# Patient Record
Sex: Male | Born: 1945
Health system: Southern US, Community
[De-identification: ages and names within clinical notes are randomized; demographics above are authoritative.]

## PROBLEM LIST (undated history)

## (undated) ENCOUNTER — Inpatient Hospital Stay: Admission: EM | Payer: Self-pay | Source: Home / Self Care

## (undated) DIAGNOSIS — I82409 Acute embolism and thrombosis of unspecified deep veins of unspecified lower extremity: Secondary | ICD-10-CM

## (undated) DIAGNOSIS — I2510 Atherosclerotic heart disease of native coronary artery without angina pectoris: Secondary | ICD-10-CM

## (undated) DIAGNOSIS — E7800 Pure hypercholesterolemia, unspecified: Secondary | ICD-10-CM

## (undated) HISTORY — PX: KNEE SURGERY: SHX244

## (undated) HISTORY — DX: Acute embolism and thrombosis of unspecified deep veins of unspecified lower extremity: I82.409

## (undated) HISTORY — PX: FRACTURE SURGERY: SHX138

## (undated) HISTORY — DX: Pure hypercholesterolemia, unspecified: E78.00

## (undated) HISTORY — PX: CARDIAC CATHETERIZATION: SHX172

## (undated) HISTORY — PX: OTHER SURGICAL HISTORY: SHX169

## (undated) HISTORY — PX: ROTATOR CUFF REPAIR: SHX139

## (undated) HISTORY — PX: CHOLECYSTECTOMY: SHX55

## (undated) HISTORY — DX: Atherosclerotic heart disease of native coronary artery without angina pectoris: I25.10

---

## 2001-11-16 ENCOUNTER — Ambulatory Visit (HOSPITAL_COMMUNITY): Admission: RE | Admit: 2001-11-16 | Discharge: 2001-11-16 | Payer: Self-pay | Admitting: Family Medicine

## 2001-11-16 ENCOUNTER — Encounter: Payer: Self-pay | Admitting: Family Medicine

## 2001-11-26 ENCOUNTER — Ambulatory Visit (HOSPITAL_COMMUNITY): Admission: RE | Admit: 2001-11-26 | Discharge: 2001-11-26 | Payer: Self-pay | Admitting: General Surgery

## 2004-10-01 ENCOUNTER — Ambulatory Visit (HOSPITAL_COMMUNITY): Admission: RE | Admit: 2004-10-01 | Discharge: 2004-10-01 | Payer: Self-pay | Admitting: Family Medicine

## 2007-01-08 ENCOUNTER — Ambulatory Visit (HOSPITAL_COMMUNITY): Payer: Self-pay | Admitting: Oncology

## 2007-01-08 ENCOUNTER — Encounter (HOSPITAL_COMMUNITY): Admission: RE | Admit: 2007-01-08 | Discharge: 2007-02-07 | Payer: Self-pay | Admitting: Oncology

## 2007-06-02 ENCOUNTER — Encounter (HOSPITAL_COMMUNITY): Admission: RE | Admit: 2007-06-02 | Discharge: 2007-06-16 | Payer: Self-pay | Admitting: Oncology

## 2007-06-02 ENCOUNTER — Ambulatory Visit (HOSPITAL_COMMUNITY): Payer: Self-pay | Admitting: Oncology

## 2007-11-23 ENCOUNTER — Ambulatory Visit (HOSPITAL_COMMUNITY): Admission: RE | Admit: 2007-11-23 | Discharge: 2007-11-23 | Payer: Self-pay | Admitting: Family Medicine

## 2009-05-25 ENCOUNTER — Ambulatory Visit: Payer: Self-pay | Admitting: Surgery

## 2010-10-24 ENCOUNTER — Ambulatory Visit: Payer: Self-pay | Admitting: Gastroenterology

## 2010-10-29 NOTE — Procedures (Signed)
DUPLEX DEEP VENOUS EXAM - LOWER EXTREMITY   INDICATION:  Previous PE.  Previous DVT.   HISTORY:  Edema:  No.  Trauma/Surgery:  No.  Pain:  No.  PE:  Yes, December, 2009.  Previous DVT:  Yes, right leg.  Anticoagulants:  On Coumadin.  Other:   DUPLEX EXAM:                CFV   SFV   PopV  PTV    GSV                R  L  R  L  R  L  R   L  R  L  Thrombosis    o  o  o  o  o  o  o   o  o  o  Spontaneous   +  +  +  +  +  +  +   +  +  +  Phasic        +  +  +  +  +  +  +   +  +  +  Augmentation  +  +  +  +  +  +  +   +  +  +  Compressible  +  +  +  +  +  +  +   +  +  +  Competent     +  +  +  +  +  +  +   +  +  +   Legend:  + - yes  o - no  p - partial  D - decreased   IMPRESSION:  There does not appear to be any evidence of deep venous  thrombosis in bilateral legs.    _____________________________  V. Charlena Cross, MD   CB/MEDQ  D:  05/25/2009  T:  05/26/2009  Job:  [id number]

## 2010-11-01 NOTE — Op Note (Signed)
[facility]  Patient:    [name] [name], [name] [name] Visit Number: [id number] MRN: [id number]          Service Type: DSU Location: DAY Attending Physician:  Dalia Heading Dictated by:   Franky Macho, M.D. Proc. Date: 11/26/01 Admit Date:  11/26/2001   CC:         Karleen Hampshire, M.D.   Operative Report  PREOPERATIVE DIAGNOSES:  Cholecystitis, cholelithiasis.  POSTOPERATIVE DIAGNOSES:  Cholecystitis, cholelithiasis.  PROCEDURE:  Laparoscopic cholecystectomy.  SURGEON:  Franky Macho, M.D.  ASSISTANT:  Arna Snipe, M.D.  ANESTHESIA:  General endotracheal.  INDICATIONS FOR PROCEDURE:  The patient is a 65 year old white male who was referred for evaluation and treatment of biliary colic secondary to adenomyosis and cholelithiasis. The risks and benefits of the procedure including bleeding, infection, hepatobiliary injury, and the possibility of an open procedure were fully explained to the patient, who gave informed consent.  DESCRIPTION OF PROCEDURE:  The patient was placed in the supine position. After induction of general endotracheal anesthesia, the abdomen was prepped and draped using the usual sterile technique with Betadine. A supraumbilical incision was made down to the fascia. A Veress needle was introduced into the abdominal cavity and confirmation of placement was done using the saline drop test. The abdomen was then insufflated to 16 mmHg pressure. An 11 mm trocar was introduced into the abdominal cavity under direct visualization without difficulty. The patient was placed in reverse Trendelenburg position. An additional 11 mm trocar was placed in the epigastric region and 5 mm trocars placed in the right upper quadrant and right flank regions. The liver was inspected and noted to be within normal limits. The gallbladder was retracted superiorly and laterally. The dissection was begun around the infundibulum of the gallbladder. The cystic  duct was first identified. Its junction to the infundibulum fully identified. The clips were placed proximally and distally on the cystic duct and the cystic duct was divided This was likewise done in the cystic artery. The gallbladder was then freed away from the gallbladder fossa using Bovie electrocautery. The gallbladder was delivered through the epigastric trocar site using and endocatch bag. The gallbladder fossa was inspected and no abnormal bleeding or bile leakage was noted. Surgicel was placed in the gallbladder fossa. The subhepatic space as well as the right hepatic gutter were irrigated with normal saline. All fluid and air were then evacuated from the abdominal cavity prior to removing the trocars.  All wounds were irrigated with normal saline. All wounds were injected with 0.5% Sensorcaine. The supraumbilical fascia was reapproximated using an #0 Vicryl interrupted suture. All skin incisions were closed using staples. Betadine ointment and dry sterile dressings were applied.  All tape and needle counts were correct at the end of the procedure. The patient was extubated in the operating room and went back to the recovery room awake in stable condition.  COMPLICATIONS:  None.  SPECIMEN:  Gallbladder.  ESTIMATED BLOOD LOSS:  Minimal. Dictated by:   Franky Macho, M.D. Attending Physician:  Dalia Heading DD:  11/26/01 TD:  11/27/01 Job: [id number]

## 2010-11-01 NOTE — H&P (Signed)
[facility]  Patient:    [name] [name] Visit Number: [id number] MRN: [id number]          Service Type: OUT Location: RAD Attending Physician:  [name] [name] Dictated by:   [name] [name], M.D. Admit Date:  11/16/2001 Discharge Date: 11/16/2001   CC:         [name] [name], M.D.   History and Physical  AGE:  65 years old.  CHIEF COMPLAINT:  Biliary colic.  HISTORY OF PRESENT ILLNESS:  The patient is a 65 year old white male who is referred for evaluation and treatment of right upper quadrant abdominal pain. It has been present intermittently for many years, but recently seems to have gotten worse.  He states he has had right upper quadrant abdominal pain radiating to the right flank, nausea, vomiting, and indigestion for many years.  There is a question of bloating.  No fatty food intolerance is noted by the patient.  He seems to constantly hurt now in the right upper quadrant. Nexium is not helpful.  PAST MEDICAL HISTORY:  Coronary artery disease, hypertension, high cholesterol levels.  PAST SURGICAL HISTORY:  Heart catheterization seven years ago by Dr. Lacretia [name]. [name] Fish, Jr., left inguinal herniorrhaphy, right knee surgery last year, left rotator cuff repair in the past.  CURRENT MEDICATIONS: 1. Lipitor 40 mg p.o. q.d. 2. Altace 5 mg p.o. q.d. 3. Nexium 40 mg p.o. q.d.  ALLERGIES:  No known drug allergies.  REVIEW OF SYSTEMS:  The patient denies drinking or smoking.  He denies any recent chest pain, angina, shortness of breath, leg swelling, CVA or diabetes mellitus.  He denies any bleeding disorders.  PHYSICAL EXAMINATION:  GENERAL:  On physical examination, the patient is a well-developed, well-nourished white male in no acute distress.  VITAL SIGNS:  He is afebrile and vital signs are stable.  HEENT:  No scleral icterus.  LUNGS:  Lungs are clear to auscultation with equal breath sounds bilaterally.  HEART:   Examination reveals a regular rate and rhythm without S3, S4, or murmurs.  ABDOMEN:  The abdomen is soft and nondistended.  No hepatosplenomegaly, masses, or herniae are identified.  He is tender in the right upper quadrant to palpation.  LABORATORY AND ACCESSORY DATA:  Ultrasound of the gallbladder reveals a thickened gallbladder wall with a positive Murphys sign and ______ .  IMPRESSION:  Biliary colic.  PLAN:  The patient is scheduled for a laparoscopic cholecystectomy on November 26, 2001.  The risks and benefits of the procedure including bleeding, infection, hepatobiliary injury, and the possibility of an open procedure were fully explained to the patient, who gave informed consent.  We will get cardiac records from Dr. Donnie [name]. Dictated by:   [name] [name], M.D. Attending Physician:  [name] [name] DD:  11/23/01 TD:  11/25/01 Job: [id number]

## 2011-03-21 LAB — LUPUS ANTICOAGULANT PANEL: Lupus Anticoagulant: NOT DETECTED

## 2011-03-21 LAB — APTT: aPTT: 30

## 2011-03-21 LAB — APTT MIXING STUDY SCREEN: aPTT Baseline: NORMAL

## 2011-03-21 LAB — PROTIME-INR
INR: 1
Prothrombin Time: 13.2

## 2011-03-31 LAB — APTT: aPTT: 34

## 2011-03-31 LAB — PROTIME-INR: Prothrombin Time: 18.7 — ABNORMAL HIGH

## 2011-06-17 HISTORY — PX: LUMBAR SPINE SURGERY: SHX701

## 2012-05-16 DIAGNOSIS — I82409 Acute embolism and thrombosis of unspecified deep veins of unspecified lower extremity: Secondary | ICD-10-CM

## 2012-05-16 HISTORY — DX: Acute embolism and thrombosis of unspecified deep veins of unspecified lower extremity: I82.409

## 2012-05-31 ENCOUNTER — Other Ambulatory Visit (HOSPITAL_COMMUNITY): Payer: Self-pay | Admitting: Physician Assistant

## 2012-05-31 DIAGNOSIS — I872 Venous insufficiency (chronic) (peripheral): Secondary | ICD-10-CM

## 2012-06-01 ENCOUNTER — Emergency Department (HOSPITAL_COMMUNITY)
Admission: EM | Admit: 2012-06-01 | Discharge: 2012-06-01 | Disposition: A | Discharge status: Home / Self Care | Payer: Medicare Other | Attending: Emergency Medicine | Admitting: Emergency Medicine

## 2012-06-01 ENCOUNTER — Ambulatory Visit (HOSPITAL_COMMUNITY)
Admission: RE | Admit: 2012-06-01 | Discharge: 2012-06-01 | Disposition: A | Discharge status: Home / Self Care | Payer: Medicare Other | Source: Ambulatory Visit | Attending: Physician Assistant | Admitting: Physician Assistant

## 2012-06-01 ENCOUNTER — Encounter (HOSPITAL_COMMUNITY): Payer: Self-pay

## 2012-06-01 ENCOUNTER — Emergency Department (HOSPITAL_COMMUNITY): Payer: Medicare Other

## 2012-06-01 DIAGNOSIS — Z9089 Acquired absence of other organs: Secondary | ICD-10-CM | POA: Insufficient documentation

## 2012-06-01 DIAGNOSIS — I824Y9 Acute embolism and thrombosis of unspecified deep veins of unspecified proximal lower extremity: Secondary | ICD-10-CM | POA: Insufficient documentation

## 2012-06-01 DIAGNOSIS — I872 Venous insufficiency (chronic) (peripheral): Secondary | ICD-10-CM

## 2012-06-01 DIAGNOSIS — F17200 Nicotine dependence, unspecified, uncomplicated: Secondary | ICD-10-CM | POA: Insufficient documentation

## 2012-06-01 DIAGNOSIS — Z9889 Other specified postprocedural states: Secondary | ICD-10-CM | POA: Insufficient documentation

## 2012-06-01 LAB — CBC WITH DIFFERENTIAL/PLATELET
Basophils Absolute: 0 10*3/uL (ref 0.0–0.1)
Basophils Relative: 0 % (ref 0–1)
Eosinophils Relative: 3 % (ref 0–5)
HCT: 48.4 % (ref 39.0–52.0)
MCHC: 34.5 g/dL (ref 30.0–36.0)
Monocytes Absolute: 0.7 10*3/uL (ref 0.1–1.0)
Neutro Abs: 7.8 10*3/uL — ABNORMAL HIGH (ref 1.7–7.7)
Platelets: 175 10*3/uL (ref 150–400)
RDW: 13.2 % (ref 11.5–15.5)

## 2012-06-01 LAB — BASIC METABOLIC PANEL
Calcium: 9.7 mg/dL (ref 8.4–10.5)
Chloride: 101 mEq/L (ref 96–112)
Creatinine, Ser: 1.22 mg/dL (ref 0.50–1.35)
GFR calc Af Amer: 70 mL/min — ABNORMAL LOW (ref >90)
Sodium: 139 mEq/L (ref 135–145)

## 2012-06-01 LAB — PROTIME-INR
INR: 1.58 — ABNORMAL HIGH (ref 0.00–1.49)
Prothrombin Time: 18.4 seconds — ABNORMAL HIGH (ref 11.6–15.2)

## 2012-06-01 MED ORDER — ENOXAPARIN SODIUM 100 MG/ML ~~LOC~~ SOLN
85.0000 mg | Freq: Once | SUBCUTANEOUS | Status: AC
  Administered 2012-06-01: 85 mg via SUBCUTANEOUS
  Filled 2012-06-01: qty 1

## 2012-06-01 MED ORDER — ENOXAPARIN SODIUM 150 MG/ML ~~LOC~~ SOLN: 80.0000 mg | Freq: Every day | SUBCUTANEOUS | Status: DC

## 2012-06-01 NOTE — ED Provider Notes (Signed)
History   This chart was scribed for [name] Hutching, MD by Sofie Rower, ED Scribe. The patient was seen in room APA18/APA18 and the patient's care was started at 9:10AM.   CSN: [id number]  Arrival date & time 06/01/12  2440   First MD Initiated Contact with Patient 06/01/12 [phone]      Chief Complaint  Patient presents with  . Leg Swelling    (Consider location/radiation/quality/duration/timing/severity/associated sxs/prior treatment) The history is provided by the patient. No language interpreter was used.    [name] [name] is a 66 y.o. male , with a hx of DVT (occured 7 years ago, successfully treated with Lovenox), cholecystectomy, left knee surgery, and left foot surgery, who presents to the Emergency Department complaining of sudden, progressively worsening, leg swelling located at the proximal, medial, right thigh, onset one week ago. The pt reports he was taking warfarin 4 months ago, however, he ceased its application to due experiencing blood clots.  The pt denies chest pain. Additionally, pt denies any hx of hypertension or diabetes.   The pt is a current everyday smoker, in addition to drinking alcohol.   PCP is Dr. Regino Schultze.    History reviewed. No pertinent past medical history.  Past Surgical History  Procedure Date  . Cholecystectomy   . Fracture surgery     No family history on file.  History  Substance Use Topics  . Smoking status: Current Every Day Smoker  . Smokeless tobacco: Not on file  . Alcohol Use: Yes      Review of Systems  All other systems reviewed and are negative.    Allergies  Review of patient's allergies indicates no known allergies.  Home Medications  No current outpatient prescriptions on file.  BP 124/77  Pulse 85  Resp 16  Ht 6\' 2"  (1.88 m)  Wt 190 lb (86.183 kg)  BMI 24.39 kg/m2  SpO2 97%  Physical Exam  Nursing note and vitals reviewed. Constitutional: He is oriented to person, place, and time. He appears well-developed  and well-nourished.  HENT:  Head: Normocephalic and atraumatic.  Eyes: Conjunctivae normal and EOM are normal. Pupils are equal, round, and reactive to light.  Neck: Normal range of motion. Neck supple.  Cardiovascular: Normal rate, regular rhythm and normal heart sounds.   Pulmonary/Chest: Effort normal and breath sounds normal.  Abdominal: Soft. Bowel sounds are normal.  Musculoskeletal: Normal range of motion.       Swelling detected at the Proximal medial right thigh.  Neurological: He is alert and oriented to person, place, and time.  Skin: Skin is warm and dry.  Psychiatric: He has a normal mood and affect.    ED Course  Procedures (including critical care time)  DIAGNOSTIC STUDIES: Oxygen Saturation is 97% on room air, normal by my interpretation.    COORDINATION OF CARE:  9:31 AM- Treatment plan concerning ultrasound of right lower extremity discussed with patient. Pt agrees with treatment.     Results for orders placed during the hospital encounter of 06/01/12  CBC WITH DIFFERENTIAL      Component Value Range   WBC 10.3  4.0 - 10.5 K/uL   RBC 5.18  4.22 - 5.81 MIL/uL   Hemoglobin 16.7  13.0 - 17.0 g/dL   HCT 25.3  66.4 - 40.3 %   MCV 93.4  78.0 - 100.0 fL   MCH 32.2  26.0 - 34.0 pg   MCHC 34.5  30.0 - 36.0 g/dL   RDW 47.4  25.9 - 56.3 %  Platelets 175  150 - 400 K/uL   Neutrophils Relative 75  43 - 77 %   Neutro Abs 7.8 (*) 1.7 - 7.7 K/uL   Lymphocytes Relative 16  12 - 46 %   Lymphs Abs 1.6  0.7 - 4.0 K/uL   Monocytes Relative 6  3 - 12 %   Monocytes Absolute 0.7  0.1 - 1.0 K/uL   Eosinophils Relative 3  0 - 5 %   Eosinophils Absolute 0.3  0.0 - 0.7 K/uL   Basophils Relative 0  0 - 1 %   Basophils Absolute 0.0  0.0 - 0.1 K/uL  BASIC METABOLIC PANEL      Component Value Range   Sodium 139  135 - 145 mEq/L   Potassium 4.6  3.5 - 5.1 mEq/L   Chloride 101  96 - 112 mEq/L   CO2 30  19 - 32 mEq/L   Glucose, Bld 110 (*) 70 - 99 mg/dL   BUN 11  6 - 23 mg/dL    Creatinine, Ser 8.65  0.50 - 1.35 mg/dL   Calcium 9.7  8.4 - 78.4 mg/dL   GFR calc non Af Amer 61 (*) >90 mL/min   GFR calc Af Amer 70 (*) >90 mL/min  PROTIME-INR      Component Value Range   Prothrombin Time 18.4 (*) 11.6 - 15.2 seconds   INR 1.58 (*) 0.00 - 1.49   US Venous Img Lower Unilateral Right  06/01/2012  *RADIOLOGY REPORT*  Clinical Data: Right thigh pain.  Right LOWER EXTREMITY VENOUS DUPLEX ULTRASOUND  Technique:  Gray-scale sonography with graded compression, as well as color Doppler and duplex ultrasound, were performed to evaluate the deep venous system of the lower extremity from the level of the common femoral vein through the popliteal and proximal calf veins. Spectral Doppler was utilized to evaluate flow at rest and with distal augmentation maneuvers.  Comparison:  None.  Findings: The study is positive for deep venous thrombosis with clot seen from the right common femoral vein through the popliteal vein.  There is partial occlusion of the right common femoral vein. Occlusive thrombus is seen from the profunda femoris through the superficial femoral vein with no compressibility or augmentation of flow and echogenic clot present.  Partially occlusive thrombus is seen in the popliteal vein.  IMPRESSION: Extensive deep venous thrombosis right leg as described.   Original Report Authenticated By: Holley Dexter, M.D.         No diagnosis found.    MDM  Doppler study of right lower extremity shows extensive deep venous thrombosis from the right common femoral vein to the popliteal vein.  Rx IV Lovenox 1 mg per kilogram subcutaneous.  Prescription given for same. Will follow up with primary care Dr. This week.      I personally performed the services described in this documentation, which was scribed in my presence. The recorded information has been reviewed and is accurate.    [name] Hutching, MD 06/01/12 [phone]

## 2012-12-16 ENCOUNTER — Encounter: Payer: Self-pay | Admitting: Gastroenterology

## 2012-12-30 ENCOUNTER — Encounter: Payer: Self-pay | Admitting: Internal Medicine

## 2013-01-03 ENCOUNTER — Encounter: Payer: Self-pay | Admitting: Gastroenterology

## 2013-01-03 ENCOUNTER — Ambulatory Visit (INDEPENDENT_AMBULATORY_CARE_PROVIDER_SITE_OTHER): Payer: Medicare Other | Admitting: Gastroenterology

## 2013-01-03 VITALS — BP 108/79 | HR 89 | Temp 97.4°F | Ht 74.0 in | Wt 198.2 lb

## 2013-01-03 DIAGNOSIS — Z7901 Long term (current) use of anticoagulants: Secondary | ICD-10-CM

## 2013-01-03 DIAGNOSIS — K920 Hematemesis: Secondary | ICD-10-CM

## 2013-01-03 DIAGNOSIS — Z1211 Encounter for screening for malignant neoplasm of colon: Secondary | ICD-10-CM

## 2013-01-03 NOTE — Assessment & Plan Note (Signed)
67 year old gentleman with a 5 to seven-day history of vomiting with eventual hematemesis per report. Patient does not recall seeing blood but this is when his daughter stated to him. His INR was supratherapeutic as outlined above. Question gastritis versus peptic ulcer disease versus Mallory-Weiss tear. Patient denies ongoing vomiting or hematemesis. He denies melena or hematochezia. She's never had an upper endoscopy or colonoscopy. He is on Coumadin for history of recurrent DVT. States he could not afford the Xarelto which was recently prescribed. States he never had workup for recurrent DVT but admits to being fairly inactive.  Discussed the need to tightly control INR with patient. Initially he was quite reluctant to any endoscopic evaluation but explained to him that we could not rule out underlying malignancy or other etiology that could be problematic with chronic anticoagulation. At this time he is agreeable to upper endoscopy and colonoscopy (which is predominantly screening) in the near future. We will contact his PCP to determine whether or not he should have Lovenox bridge in the setting of recurrent DVT off Coumadin. Once this information has been obtained we will schedule him for an EGD and colonoscopy.  I have discussed the risks, alternatives, benefits with regards to but not limited to the risk of reaction to medication, bleeding, infection, perforation and the patient is agreeable to proceed. Written consent to be obtained.

## 2013-01-03 NOTE — Patient Instructions (Addendum)
1. We will call to schedule your colonoscopy and upper endoscopy as soon as I speak with your primary care provider regarding Coumadin. Further recommendations to follow.

## 2013-01-03 NOTE — Progress Notes (Signed)
Cc PCP 

## 2013-01-03 NOTE — Progress Notes (Signed)
Primary Care Physician:  [name] Ruths, MD  Primary Gastroenterologist:  Roetta Sessions, MD   Chief Complaint  Patient presents with  . Colonoscopy    HPI:  [name] [name] is a 67 y.o. male here for further evaluation and hematemesis, hematochezia/melena which developed when his INR was greater than 7.9. Patient denies hematochezia and melena.  States he has been on coumadin chronically for DVT. Has had two DVTs, last one in 05/2012. First DVT treated with coumadin but came off medication at some point. Patient cannot recall facts. Then developed second DVT 05/2012 (off coumadin at the time). States he was not real good at getting INR checked.   Developed N/V for 5-7 days. Went to ER at [facility], transferred to [facility]. Per patient, his daughter said was throwing up blood. He does not recall. No blood in stool per patient. No constipation, diarrhea. No abdominal pain. No significant heartburn. No dysphagia. Never started pantoprazole as recommended by PCP. No unintentional weight loss.  Reviewed records available via "Care Everywhere".  H. Pylori serology Negative. Labs from 12/12/2012, hemoglobin 13.1, hematocrit 38.6, MCV 91.7, platelets 192,000, white blood cell count 7400. On presentation at [facility] on 12/10/12 his PT was greater than 100, INR cannot be calculated. INR was 1.43 on 12/12/2012.      Current Outpatient Prescriptions  Medication Sig Dispense Refill  . aspirin 81 MG tablet Take 81 mg by mouth daily.      Marland Kitchen LORazepam (ATIVAN) 1 MG tablet Take 1 mg by mouth every 8 (eight) hours as needed.       . warfarin (COUMADIN) 5 MG tablet Take 5 mg by mouth every other day. Alternates 5/6mg .       No current facility-administered medications for this visit.    Allergies as of 01/03/2013  . (No Known Allergies)    Past Medical History  Diagnosis Date  . Coronary artery disease     MI over 20 years ago.  Marland Kitchen Hypertension   . High cholesterol   . DVT (deep venous thrombosis)  05/2012    once prior to 05/2012 as well. Had stopped coumadin and developed second clot.     Past Surgical History  Procedure Laterality Date  . Cholecystectomy    . Fracture surgery    . Cardiac catheterization    . Left inguinal herniorrhaphy    . Knee surgery      right  . Rotator cuff repair      left  . Lumbar spine surgery  2013    Family History  Problem Relation Age of Onset  . Colon cancer Neg Hx     History   Social History  . Marital Status: Divorced    Spouse Name: N/A    Number of Children: 4  . Years of Education: N/A   Occupational History  . unemployed    Social History Main Topics  . Smoking status: Current Every Day Smoker  . Smokeless tobacco: Not on file  . Alcohol Use: Yes     Comment: used to drink two beers per day in summertime, but none since 09/2012.  . Drug Use: No  . Sexually Active: Not on file   Other Topics Concern  . Not on file   Social History Narrative  . No narrative on file      ROS:  General: Negative for anorexia, weight loss, fever, chills, fatigue, weakness. Eyes: Negative for vision changes.  ENT: Negative for hoarseness, difficulty swallowing , nasal congestion. CV: Negative for  chest pain, angina, palpitations, dyspnea on exertion, peripheral edema.  Respiratory: Negative for dyspnea at rest, dyspnea on exertion, cough, sputum, wheezing.  GI: See history of present illness. GU:  Negative for dysuria, hematuria, urinary incontinence, urinary frequency, nocturnal urination.  MS: Negative for joint pain, low back pain.  Derm: Negative for rash or itching.  Neuro: Negative for weakness, abnormal sensation, seizure, frequent headaches, memory loss, confusion.  Psych: Negative for anxiety, depression, suicidal ideation, hallucinations.  Endo: Negative for unusual weight change.  Heme: Negative for bruising or bleeding. Allergy: Negative for rash or hives.    Physical Examination:  BP 108/79  Pulse 89   Temp(Src) 97.4 F (36.3 C) (Oral)  Ht 6\' 2"  (1.88 m)  Wt 198 lb 3.2 oz (89.903 kg)  BMI 25.44 kg/m2   General: Well-nourished, well-developed in no acute distress.  Head: Normocephalic, atraumatic.   Eyes: Conjunctiva pink, no icterus. Mouth: Oropharyngeal mucosa moist and pink , no lesions erythema or exudate. Neck: Supple without thyromegaly, masses, or lymphadenopathy.  Lungs: Clear to auscultation bilaterally.  Heart: Regular rate and rhythm, no murmurs rubs or gallops.  Abdomen: Bowel sounds are normal, nontender, nondistended, no hepatosplenomegaly or masses, no abdominal bruits or    hernia , no rebound or guarding.   Rectal: not performed Extremities: No lower extremity edema. No clubbing or deformities.  Neuro: Alert and oriented x 4 , grossly normal neurologically.  Skin: Warm and dry, no rash or jaundice.   Psych: Alert and cooperative, normal mood and affect.

## 2013-01-05 ENCOUNTER — Telehealth: Payer: Self-pay

## 2013-01-05 ENCOUNTER — Other Ambulatory Visit: Payer: Self-pay | Admitting: Internal Medicine

## 2013-01-05 DIAGNOSIS — Z1211 Encounter for screening for malignant neoplasm of colon: Secondary | ICD-10-CM

## 2013-01-05 MED ORDER — PEG 3350-KCL-NA BICARB-NACL 420 G PO SOLR: 4000.0000 mL | ORAL | Status: DC

## 2013-01-05 NOTE — Telephone Encounter (Signed)
Received fax back from Dr. Regino Schultze and he said ok to hold coumadin 3-4 days prior to procedure and no lovenox bridge necessary.

## 2013-01-05 NOTE — Telephone Encounter (Signed)
Mr. More is scheduled w/RMR on Aug 7th and I have mailed instructions and he is also aware to hold his coumadin for 4 days prior to procedure

## 2013-01-05 NOTE — Telephone Encounter (Signed)
Noted  

## 2013-01-12 ENCOUNTER — Encounter (HOSPITAL_COMMUNITY): Payer: Self-pay | Admitting: Pharmacy Technician

## 2013-01-20 ENCOUNTER — Encounter (HOSPITAL_COMMUNITY)
Admission: RE | Disposition: A | Discharge status: Home / Self Care | Payer: Self-pay | Source: Ambulatory Visit | Attending: Internal Medicine

## 2013-01-20 ENCOUNTER — Encounter (HOSPITAL_COMMUNITY): Payer: Self-pay | Admitting: *Deleted

## 2013-01-20 ENCOUNTER — Ambulatory Visit (HOSPITAL_COMMUNITY)
Admission: RE | Admit: 2013-01-20 | Discharge: 2013-01-20 | Disposition: A | Discharge status: Home / Self Care | Payer: Medicare Other | Source: Ambulatory Visit | Attending: Internal Medicine | Admitting: Internal Medicine

## 2013-01-20 DIAGNOSIS — D126 Benign neoplasm of colon, unspecified: Secondary | ICD-10-CM

## 2013-01-20 DIAGNOSIS — I10 Essential (primary) hypertension: Secondary | ICD-10-CM | POA: Insufficient documentation

## 2013-01-20 DIAGNOSIS — K5730 Diverticulosis of large intestine without perforation or abscess without bleeding: Secondary | ICD-10-CM

## 2013-01-20 DIAGNOSIS — K920 Hematemesis: Secondary | ICD-10-CM

## 2013-01-20 DIAGNOSIS — K449 Diaphragmatic hernia without obstruction or gangrene: Secondary | ICD-10-CM | POA: Insufficient documentation

## 2013-01-20 DIAGNOSIS — A048 Other specified bacterial intestinal infections: Secondary | ICD-10-CM | POA: Insufficient documentation

## 2013-01-20 DIAGNOSIS — R933 Abnormal findings on diagnostic imaging of other parts of digestive tract: Secondary | ICD-10-CM

## 2013-01-20 DIAGNOSIS — K921 Melena: Secondary | ICD-10-CM

## 2013-01-20 DIAGNOSIS — K2940 Chronic atrophic gastritis without bleeding: Secondary | ICD-10-CM | POA: Insufficient documentation

## 2013-01-20 DIAGNOSIS — Z1211 Encounter for screening for malignant neoplasm of colon: Secondary | ICD-10-CM

## 2013-01-20 DIAGNOSIS — Z7901 Long term (current) use of anticoagulants: Secondary | ICD-10-CM | POA: Insufficient documentation

## 2013-01-20 HISTORY — PX: COLONOSCOPY: SHX5424

## 2013-01-20 HISTORY — PX: ESOPHAGOGASTRODUODENOSCOPY: SHX5428

## 2013-01-20 SURGERY — COLONOSCOPY
Anesthesia: Moderate Sedation

## 2013-01-20 MED ORDER — BUTAMBEN-TETRACAINE-BENZOCAINE 2-2-14 % EX AERO
INHALATION_SPRAY | CUTANEOUS | Status: DC | PRN
  Administered 2013-01-20: 2 via TOPICAL

## 2013-01-20 MED ORDER — MIDAZOLAM HCL 5 MG/5ML IJ SOLN
INTRAMUSCULAR | Status: DC | PRN
  Administered 2013-01-20 (×2): 2 mg via INTRAVENOUS
  Administered 2013-01-20: 1 mg via INTRAVENOUS

## 2013-01-20 MED ORDER — MIDAZOLAM HCL 5 MG/5ML IJ SOLN
INTRAMUSCULAR | Status: AC
  Filled 2013-01-20: qty 10

## 2013-01-20 MED ORDER — ONDANSETRON HCL 4 MG/2ML IJ SOLN
INTRAMUSCULAR | Status: AC
  Filled 2013-01-20: qty 2

## 2013-01-20 MED ORDER — MEPERIDINE HCL 100 MG/ML IJ SOLN
INTRAMUSCULAR | Status: AC
  Filled 2013-01-20: qty 1

## 2013-01-20 MED ORDER — SODIUM CHLORIDE 0.9 % IV SOLN
INTRAVENOUS | Status: DC
  Administered 2013-01-20: 11:00:00 via INTRAVENOUS

## 2013-01-20 MED ORDER — MEPERIDINE HCL 100 MG/ML IJ SOLN
INTRAMUSCULAR | Status: DC | PRN
  Administered 2013-01-20: 50 mg via INTRAVENOUS
  Administered 2013-01-20 (×2): 25 mg via INTRAVENOUS

## 2013-01-20 MED ORDER — STERILE WATER FOR IRRIGATION IR SOLN
Status: DC | PRN
  Administered 2013-01-20: 11:00:00

## 2013-01-20 MED ORDER — ONDANSETRON HCL 4 MG/2ML IJ SOLN
INTRAMUSCULAR | Status: DC | PRN
  Administered 2013-01-20: 4 mg via INTRAVENOUS

## 2013-01-20 NOTE — H&P (View-Only) (Signed)
Primary Care Physician:  MCGOUGH,WILLIAM M, MD  Primary Gastroenterologist:  Michael Rourk, MD   Chief Complaint  Patient presents with  . Colonoscopy    HPI:  [name] [name] is a 67 y.o. male here for further evaluation and hematemesis, hematochezia/melena which developed when his INR was greater than 7.9. Patient denies hematochezia and melena.  States he has been on coumadin chronically for DVT. Has had two DVTs, last one in 05/2012. First DVT treated with coumadin but came off medication at some point. Patient cannot recall facts. Then developed second DVT 05/2012 (off coumadin at the time). States he was not real good at getting INR checked.   Developed N/V for 5-7 days. Went to ER at [facility], transferred to [facility]. Per patient, his daughter said was throwing up blood. He does not recall. No blood in stool per patient. No constipation, diarrhea. No abdominal pain. No significant heartburn. No dysphagia. Never started pantoprazole as recommended by PCP. No unintentional weight loss.  Reviewed records available via "Care Everywhere".  H. Pylori serology Negative. Labs from 12/12/2012, hemoglobin 13.1, hematocrit 38.6, MCV 91.7, platelets 192,000, white blood cell count 7400. On presentation at [facility] on 12/10/12 his PT was greater than 100, INR cannot be calculated. INR was 1.43 on 12/12/2012.      Current Outpatient Prescriptions  Medication Sig Dispense Refill  . aspirin 81 MG tablet Take 81 mg by mouth daily.      . LORazepam (ATIVAN) 1 MG tablet Take 1 mg by mouth every 8 (eight) hours as needed.       . warfarin (COUMADIN) 5 MG tablet Take 5 mg by mouth every other day. Alternates 5/6mg.       No current facility-administered medications for this visit.    Allergies as of 01/03/2013  . (No Known Allergies)    Past Medical History  Diagnosis Date  . Coronary artery disease     MI over 20 years ago.  . Hypertension   . High cholesterol   . DVT (deep venous thrombosis)  05/2012    once prior to 05/2012 as well. Had stopped coumadin and developed second clot.     Past Surgical History  Procedure Laterality Date  . Cholecystectomy    . Fracture surgery    . Cardiac catheterization    . Left inguinal herniorrhaphy    . Knee surgery      right  . Rotator cuff repair      left  . Lumbar spine surgery  2013    Family History  Problem Relation Age of Onset  . Colon cancer Neg Hx     History   Social History  . Marital Status: Divorced    Spouse Name: N/A    Number of Children: 4  . Years of Education: N/A   Occupational History  . unemployed    Social History Main Topics  . Smoking status: Current Every Day Smoker  . Smokeless tobacco: Not on file  . Alcohol Use: Yes     Comment: used to drink two beers per day in summertime, but none since 09/2012.  . Drug Use: No  . Sexually Active: Not on file   Other Topics Concern  . Not on file   Social History Narrative  . No narrative on file      ROS:  General: Negative for anorexia, weight loss, fever, chills, fatigue, weakness. Eyes: Negative for vision changes.  ENT: Negative for hoarseness, difficulty swallowing , nasal congestion. CV: Negative for   chest pain, angina, palpitations, dyspnea on exertion, peripheral edema.  Respiratory: Negative for dyspnea at rest, dyspnea on exertion, cough, sputum, wheezing.  GI: See history of present illness. GU:  Negative for dysuria, hematuria, urinary incontinence, urinary frequency, nocturnal urination.  MS: Negative for joint pain, low back pain.  Derm: Negative for rash or itching.  Neuro: Negative for weakness, abnormal sensation, seizure, frequent headaches, memory loss, confusion.  Psych: Negative for anxiety, depression, suicidal ideation, hallucinations.  Endo: Negative for unusual weight change.  Heme: Negative for bruising or bleeding. Allergy: Negative for rash or hives.    Physical Examination:  BP 108/79  Pulse 89   Temp(Src) 97.4 F (36.3 C) (Oral)  Ht 6' 2" (1.88 m)  Wt 198 lb 3.2 oz (89.903 kg)  BMI 25.44 kg/m2   General: Well-nourished, well-developed in no acute distress.  Head: Normocephalic, atraumatic.   Eyes: Conjunctiva pink, no icterus. Mouth: Oropharyngeal mucosa moist and pink , no lesions erythema or exudate. Neck: Supple without thyromegaly, masses, or lymphadenopathy.  Lungs: Clear to auscultation bilaterally.  Heart: Regular rate and rhythm, no murmurs rubs or gallops.  Abdomen: Bowel sounds are normal, nontender, nondistended, no hepatosplenomegaly or masses, no abdominal bruits or    hernia , no rebound or guarding.   Rectal: not performed Extremities: No lower extremity edema. No clubbing or deformities.  Neuro: Alert and oriented x 4 , grossly normal neurologically.  Skin: Warm and dry, no rash or jaundice.   Psych: Alert and cooperative, normal mood and affect.   

## 2013-01-20 NOTE — Op Note (Signed)
[facility] [address] [place], [address]   ENDOSCOPY PROCEDURE REPORT  PATIENT: [name] [name], [name] [name]  MR#: [id number] BIRTHDATE: 1946-01-31 , 67  yrs. old GENDER: Male ENDOSCOPIST: R.  Roetta Sessions, MD FACP FACG REFERRED BY:  Karleen Hampshire, M.D. PROCEDURE DATE:  01/20/2013 PROCEDURE:     EGD with esophageal and gastric biopsy  INDICATIONS:     Recent hematemesis in the setting of over anticoagulation  INFORMED CONSENT:   The risks, benefits, limitations, alternatives and imponderables have been discussed.  The potential for biopsy, esophogeal dilation, etc. have also been reviewed.  Questions have been answered.  All parties agreeable.  Please see the history and physical in the medical record for more information.  MEDICATIONS:     Versed 4 mg IV and Demerol 75 mg  and Zofran 4 mg  DESCRIPTION OF PROCEDURE:   The XB-[id number] ([id number])  endoscope was introduced through the mouth and advanced to the second portion of the duodenum without difficulty or limitations.  The mucosal surfaces were surveyed very carefully during advancement of the scope and upon withdrawal.  Retroflexion view of the proximal stomach and esophagogastric junction was performed.      FINDINGS: Salmon-colored epithelium coming up 3 cm into the distal esophagus. No esophagitis. No tumor. EG junction easily traversed. Stomach empty. Small hiatal hernia. Some reticulated or "snake skin" appearance of the gastric mucosa diffusely. No ulcer or infiltrating process. Patent pylorus. Normal first and second portion of the duodenum  THERAPEUTIC / DIAGNOSTIC MANEUVERS PERFORMED:  Biopsies of the distal esophagus and gastric mucosa taken   COMPLICATIONS:  None  IMPRESSION:   Abnormal esophagus and stomach-status post biopsy. Hiatal hernia.  RECOMMENDATIONS:  Followup on pathology. See colonoscopy report    _______________________________ R. Roetta Sessions, MD FACP [facility] eSigned:  R.  Roetta Sessions, MD FACP [facility] 01/20/2013 11:29 AM     CC:

## 2013-01-20 NOTE — Op Note (Signed)
[facility] [address] [place], [address]   COLONOSCOPY PROCEDURE REPORT  PATIENT: [name] [name], [name] [name]  MR#:         [id number] BIRTHDATE: [birth year] , 67  yrs. old GENDER: Male ENDOSCOPIST: R.  Roetta Sessions, MD FACP FACG REFERRED BY:  Karleen Hampshire, M.D. PROCEDURE DATE:  01/20/2013 PROCEDURE:     Colonoscopy with multiple snare polypectomies  INDICATIONS: Recent hematochezia in the setting of over anticoagulation  INFORMED CONSENT:  The risks, benefits, alternatives and imponderables including but not limited to bleeding, perforation as well as the possibility of a missed lesion have been reviewed.  The potential for biopsy, lesion removal, etc. have also been discussed.  Questions have been answered.  All parties agreeable. Please see the history and physical in the medical record for more information.  MEDICATIONS: Versed 5 mg IV and Demerol 100 mg IV in divided doses. Zofran 4 mg IV.  DESCRIPTION OF PROCEDURE:  After a digital rectal exam was performed, the EG-[id number] ([id number]) and EC-[id number] ([id number]) colonoscope was advanced from the anus through the rectum and colon to the area of the cecum, ileocecal valve and appendiceal orifice. The cecum was deeply intubated.  These structures were well-seen and photographed for the record.  From the level of the cecum and ileocecal valve, the scope was slowly and cautiously withdrawn. The mucosal surfaces were carefully surveyed utilizing scope tip deflection to facilitate fold flattening as needed.  The scope was pulled down into the rectum where a thorough examination including retroflexion was performed.    FINDINGS:  Adequate preparation. Normal rectum. Pancolonic diverticulosis. Multiple in 6-8 mm pedunculated polyps in the descending, hepatic flexure, ascending and cecal segments.  THERAPEUTIC / DIAGNOSTIC MANEUVERS PERFORMED:  Multiple hot and cold snare polypectomies performed. All polyps seen were  removed.  COMPLICATIONS: none  CECAL WITHDRAWAL TIME:  16 minutes  IMPRESSION:  Colonic diverticulosis. Colonic polyps-multiple-status post multiple snare polypectomies as described above  RECOMMENDATIONS: Followup on pathology. Resume Coumadin tomorrow. See EGD report.   _______________________________ eSigned:  R. Roetta Sessions, MD FACP [facility] 01/20/2013 12:03 PM   CC:

## 2013-01-20 NOTE — Interval H&P Note (Signed)
History and Physical Interval Note:  01/20/2013 11:00 AM  [name] [name]  has presented today for surgery, with the diagnosis of SCREENING COLONOSCOPY  The various methods of treatment have been discussed with the patient and family. After consideration of risks, benefits and other options for treatment, the patient has consented to  Procedure(s) with comments: COLONOSCOPY (N/A) - 10:45 as a surgical intervention .  The patient's history has been reviewed, patient examined, no change in status, stable for surgery.  I have reviewed the patient's chart and labs.  Questions were answered to the patient's satisfaction.     [name] [name]  EGD and colonoscopy have been done for history of hematemesis, melena and hematochezia in a setting of marked over anticoagulation. Coumadin held x4 days.  The risks, benefits, limitations, imponderables and alternatives regarding both EGD and colonoscopy have been reviewed with the patient. Questions have been answered. All parties agreeable.

## 2013-01-22 ENCOUNTER — Encounter: Payer: Self-pay | Admitting: Internal Medicine

## 2013-01-24 ENCOUNTER — Telehealth: Payer: Self-pay

## 2013-01-24 ENCOUNTER — Encounter (HOSPITAL_COMMUNITY): Payer: Self-pay | Admitting: Internal Medicine

## 2013-01-24 NOTE — Telephone Encounter (Signed)
Letter from: Corbin Ade  Reason for Letter: Results Review Send letter to patient.  Send copy of letter with path to referring provider and PCP.  Pt getting a lot of info in letters; needs ov w extender to review 6 weeks  Needs pylera - 3 caps Qid x 10 days; needs protonix 40 mg BID during HP RX. Thereafter, needs Protonix 40 mg daily; please call in RX x 1 year; when staring pylera, decrease coumadin dose by 50% and check INR 5 days into treatment

## 2013-01-25 MED ORDER — PANTOPRAZOLE SODIUM 40 MG PO TBEC: 40.0000 mg | DELAYED_RELEASE_TABLET | Freq: Every day | ORAL | Status: DC

## 2013-01-25 MED ORDER — BIS SUBCIT-METRONID-TETRACYC 140-125-125 MG PO CAPS: 3.0000 | ORAL_CAPSULE | Freq: Three times a day (TID) | ORAL | Status: DC

## 2013-01-25 MED ORDER — PANTOPRAZOLE SODIUM 40 MG PO TBEC: 40.0000 mg | DELAYED_RELEASE_TABLET | Freq: Two times a day (BID) | ORAL | Status: DC

## 2013-01-25 NOTE — Telephone Encounter (Signed)
rx's have been sent to the pharmacy.  

## 2013-01-25 NOTE — Telephone Encounter (Signed)
Reminders in epic and OV made for 9/30

## 2013-01-25 NOTE — Telephone Encounter (Signed)
Tried to call pt- LMOM 

## 2013-01-25 NOTE — Telephone Encounter (Signed)
Cc PCP 

## 2013-01-25 NOTE — Telephone Encounter (Signed)
Pt is aware of all recommendations. Letter has been mailed. Darl Pikes, please schedule ov in 6 weeks and nic follow up egd/tcs Leighann, please cc pcp

## 2013-03-14 ENCOUNTER — Encounter: Payer: Self-pay | Admitting: Internal Medicine

## 2013-03-15 ENCOUNTER — Ambulatory Visit: Payer: Medicare Other | Admitting: Gastroenterology

## 2013-03-15 ENCOUNTER — Telehealth: Payer: Self-pay | Admitting: Gastroenterology

## 2013-03-15 ENCOUNTER — Encounter: Payer: Self-pay | Admitting: Gastroenterology

## 2013-03-15 NOTE — Telephone Encounter (Signed)
MAILED LETTER °

## 2013-03-15 NOTE — Telephone Encounter (Signed)
Pt was a no show

## 2014-01-17 ENCOUNTER — Encounter: Payer: Self-pay | Admitting: Internal Medicine

## 2014-01-23 ENCOUNTER — Encounter: Payer: Self-pay | Admitting: Internal Medicine

## 2015-05-02 DIAGNOSIS — Z6827 Body mass index (BMI) 27.0-27.9, adult: Secondary | ICD-10-CM | POA: Diagnosis not present

## 2015-05-02 DIAGNOSIS — I10 Essential (primary) hypertension: Secondary | ICD-10-CM | POA: Diagnosis not present

## 2015-05-02 DIAGNOSIS — Z0001 Encounter for general adult medical examination with abnormal findings: Secondary | ICD-10-CM | POA: Diagnosis not present

## 2015-05-02 DIAGNOSIS — I82409 Acute embolism and thrombosis of unspecified deep veins of unspecified lower extremity: Secondary | ICD-10-CM | POA: Diagnosis not present

## 2015-05-03 DIAGNOSIS — Z1389 Encounter for screening for other disorder: Secondary | ICD-10-CM | POA: Diagnosis not present

## 2015-05-03 DIAGNOSIS — Z6827 Body mass index (BMI) 27.0-27.9, adult: Secondary | ICD-10-CM | POA: Diagnosis not present

## 2015-05-03 DIAGNOSIS — E663 Overweight: Secondary | ICD-10-CM | POA: Diagnosis not present

## 2015-05-25 DIAGNOSIS — I4510 Unspecified right bundle-branch block: Secondary | ICD-10-CM | POA: Diagnosis not present

## 2015-05-25 DIAGNOSIS — E860 Dehydration: Secondary | ICD-10-CM | POA: Diagnosis not present

## 2015-05-25 DIAGNOSIS — R69 Illness, unspecified: Secondary | ICD-10-CM | POA: Diagnosis not present

## 2015-05-25 DIAGNOSIS — Z7901 Long term (current) use of anticoagulants: Secondary | ICD-10-CM | POA: Diagnosis not present

## 2015-05-25 DIAGNOSIS — I444 Left anterior fascicular block: Secondary | ICD-10-CM | POA: Diagnosis not present

## 2015-05-25 DIAGNOSIS — R112 Nausea with vomiting, unspecified: Secondary | ICD-10-CM | POA: Diagnosis not present

## 2015-05-25 DIAGNOSIS — R42 Dizziness and giddiness: Secondary | ICD-10-CM | POA: Diagnosis not present

## 2015-05-25 DIAGNOSIS — F17210 Nicotine dependence, cigarettes, uncomplicated: Secondary | ICD-10-CM | POA: Diagnosis not present

## 2015-05-25 DIAGNOSIS — I452 Bifascicular block: Secondary | ICD-10-CM | POA: Diagnosis not present

## 2015-05-25 DIAGNOSIS — R197 Diarrhea, unspecified: Secondary | ICD-10-CM | POA: Diagnosis not present

## 2015-05-25 DIAGNOSIS — R5383 Other fatigue: Secondary | ICD-10-CM | POA: Diagnosis not present

## 2015-05-31 DIAGNOSIS — D692 Other nonthrombocytopenic purpura: Secondary | ICD-10-CM | POA: Diagnosis not present

## 2015-05-31 DIAGNOSIS — D485 Neoplasm of uncertain behavior of skin: Secondary | ICD-10-CM | POA: Diagnosis not present

## 2015-05-31 DIAGNOSIS — L570 Actinic keratosis: Secondary | ICD-10-CM | POA: Diagnosis not present

## 2015-05-31 DIAGNOSIS — C44622 Squamous cell carcinoma of skin of right upper limb, including shoulder: Secondary | ICD-10-CM | POA: Diagnosis not present

## 2015-06-21 ENCOUNTER — Ambulatory Visit (HOSPITAL_COMMUNITY)
Admission: RE | Admit: 2015-06-21 | Discharge: 2015-06-21 | Disposition: A | Discharge status: Home / Self Care | Payer: Medicare (Managed Care) | Source: Ambulatory Visit | Attending: Family Medicine | Admitting: Family Medicine

## 2015-06-21 ENCOUNTER — Other Ambulatory Visit (HOSPITAL_COMMUNITY): Payer: Self-pay | Admitting: Family Medicine

## 2015-06-21 DIAGNOSIS — M25511 Pain in right shoulder: Secondary | ICD-10-CM | POA: Diagnosis not present

## 2015-06-21 DIAGNOSIS — Z1389 Encounter for screening for other disorder: Secondary | ICD-10-CM | POA: Diagnosis not present

## 2015-06-21 DIAGNOSIS — E663 Overweight: Secondary | ICD-10-CM | POA: Diagnosis not present

## 2015-06-21 DIAGNOSIS — W19XXXA Unspecified fall, initial encounter: Secondary | ICD-10-CM | POA: Diagnosis not present

## 2015-06-21 DIAGNOSIS — Z6827 Body mass index (BMI) 27.0-27.9, adult: Secondary | ICD-10-CM | POA: Diagnosis not present

## 2015-06-21 DIAGNOSIS — S4991XA Unspecified injury of right shoulder and upper arm, initial encounter: Secondary | ICD-10-CM | POA: Diagnosis not present

## 2015-06-21 DIAGNOSIS — C44622 Squamous cell carcinoma of skin of right upper limb, including shoulder: Secondary | ICD-10-CM | POA: Diagnosis not present

## 2015-09-10 DIAGNOSIS — I82409 Acute embolism and thrombosis of unspecified deep veins of unspecified lower extremity: Secondary | ICD-10-CM | POA: Diagnosis not present

## 2015-09-13 DIAGNOSIS — Z6827 Body mass index (BMI) 27.0-27.9, adult: Secondary | ICD-10-CM | POA: Diagnosis not present

## 2015-09-13 DIAGNOSIS — M25511 Pain in right shoulder: Secondary | ICD-10-CM | POA: Diagnosis not present

## 2015-09-13 DIAGNOSIS — Z1389 Encounter for screening for other disorder: Secondary | ICD-10-CM | POA: Diagnosis not present

## 2015-09-26 DIAGNOSIS — Z7901 Long term (current) use of anticoagulants: Secondary | ICD-10-CM | POA: Diagnosis not present

## 2015-10-02 DIAGNOSIS — Z6827 Body mass index (BMI) 27.0-27.9, adult: Secondary | ICD-10-CM | POA: Diagnosis not present

## 2015-10-02 DIAGNOSIS — M109 Gout, unspecified: Secondary | ICD-10-CM | POA: Diagnosis not present

## 2015-10-02 DIAGNOSIS — Z1389 Encounter for screening for other disorder: Secondary | ICD-10-CM | POA: Diagnosis not present

## 2015-10-23 DIAGNOSIS — Z7901 Long term (current) use of anticoagulants: Secondary | ICD-10-CM | POA: Diagnosis not present

## 2015-11-30 DIAGNOSIS — Z7901 Long term (current) use of anticoagulants: Secondary | ICD-10-CM | POA: Diagnosis not present

## 2015-12-24 ENCOUNTER — Encounter: Payer: Self-pay | Admitting: Internal Medicine

## 2016-01-17 DIAGNOSIS — Z7901 Long term (current) use of anticoagulants: Secondary | ICD-10-CM | POA: Diagnosis not present

## 2016-02-06 DIAGNOSIS — Z7901 Long term (current) use of anticoagulants: Secondary | ICD-10-CM | POA: Diagnosis not present

## 2016-02-27 DIAGNOSIS — Z7901 Long term (current) use of anticoagulants: Secondary | ICD-10-CM | POA: Diagnosis not present

## 2016-03-12 ENCOUNTER — Emergency Department (HOSPITAL_COMMUNITY)
Admission: EM | Admit: 2016-03-12 | Discharge: 2016-03-12 | Disposition: A | Discharge status: Home / Self Care | Payer: Medicare HMO | Attending: Emergency Medicine | Admitting: Emergency Medicine

## 2016-03-12 ENCOUNTER — Encounter (HOSPITAL_COMMUNITY): Payer: Self-pay

## 2016-03-12 DIAGNOSIS — I2510 Atherosclerotic heart disease of native coronary artery without angina pectoris: Secondary | ICD-10-CM | POA: Diagnosis not present

## 2016-03-12 DIAGNOSIS — H1132 Conjunctival hemorrhage, left eye: Secondary | ICD-10-CM

## 2016-03-12 DIAGNOSIS — Z79899 Other long term (current) drug therapy: Secondary | ICD-10-CM | POA: Diagnosis not present

## 2016-03-12 DIAGNOSIS — R69 Illness, unspecified: Secondary | ICD-10-CM | POA: Diagnosis not present

## 2016-03-12 DIAGNOSIS — H578 Other specified disorders of eye and adnexa: Secondary | ICD-10-CM | POA: Diagnosis present

## 2016-03-12 DIAGNOSIS — F17210 Nicotine dependence, cigarettes, uncomplicated: Secondary | ICD-10-CM | POA: Diagnosis not present

## 2016-03-12 LAB — PROTIME-INR
INR: 2.12
PROTHROMBIN TIME: 24.1 s — AB (ref 11.4–15.2)

## 2016-03-12 NOTE — ED Provider Notes (Signed)
[name] [name] Note   CSN: [place]:[id number] Arrival date & time: 03/12/16  1641     History   Chief Complaint Chief Complaint  Patient presents with  . Eye Problem    HPI [name] [name] is a 70 y.o. male.  Pt said that a blood vessel burst in his left eye around 1300.  The pt is on coumadin.  He denies any visual disturbances.  The pt went to his dr's office, but they were unable to see him and told him to come to the ED.      Past Medical History:  Diagnosis Date  . Coronary artery disease    MI over 20 years ago.  Marland Kitchen DVT (deep venous thrombosis) ([place]) 05/2012   once prior to 05/2012 as well. Had stopped coumadin and developed second clot.   . High cholesterol     Patient Active Problem List   Diagnosis Date Noted  . Chronic anticoagulation 01/03/2013  . Hematemesis 01/03/2013  . Encounter for screening colonoscopy 01/03/2013    Past Surgical History:  Procedure Laterality Date  . CARDIAC CATHETERIZATION    . CHOLECYSTECTOMY    . COLONOSCOPY N/A 01/20/2013   [id number] diverticulosis. Colonic polyps-multiple-s/p multiple snare polypectomies as described above  . ESOPHAGOGASTRODUODENOSCOPY N/A 01/20/2013   [id number] esophagus and stomach-status post biopsy/Hiatal hernia  . FRACTURE SURGERY    . KNEE SURGERY     right  . left inguinal herniorrhaphy    . LUMBAR SPINE SURGERY  2013  . ROTATOR CUFF REPAIR     left       Home Medications    Prior to Admission medications   Medication Sig Start Date End Date Taking? Authorizing [name]  atorvastatin (LIPITOR) 40 MG tablet Take 40 mg by mouth daily. 12/09/15  Yes Historical Provider, MD  famotidine (PEPCID) 20 MG tablet Take 20 mg by mouth daily.   Yes Historical Provider, MD  traMADol (ULTRAM) 50 MG tablet Take 50 mg by mouth 3 (three) times daily as needed for moderate pain or severe pain (for shoulder pain).  02/29/16  Yes Historical Provider, MD  warfarin (COUMADIN) 5 MG tablet Take 5 mg by mouth  every morning. Alternates 5-6 mg. 10/12/12  Yes Historical Provider, MD    Family History Family History  Problem Relation Age of Onset  . Colon cancer Neg Hx     Social History Social History  Substance Use Topics  . Smoking status: Current Every Day Smoker    Packs/day: 0.50    Types: Cigarettes  . Smokeless tobacco: Never Used  . Alcohol use Yes     Comment: occasional     Allergies   Review of patient's allergies indicates no known allergies.   Review of Systems Review of Systems  Eyes: Positive for redness.  All other systems reviewed and are negative.    Physical Exam Updated Vital Signs BP 103/75 (BP Location: Left Arm)   Pulse 85   Temp 98.2 F (36.8 C)   Resp 20   Ht 6\' 1"  (1.854 m)   Wt 205 lb (93 kg)   SpO2 97%   BMI 27.05 kg/m   Physical Exam  Constitutional: He is oriented to person, place, and time. He appears well-developed and well-nourished.  HENT:  Head: Normocephalic and atraumatic.  Right Ear: External ear normal.  Left Ear: External ear normal.  Nose: Nose normal.  Mouth/Throat: Oropharynx is clear and moist.  Eyes: EOM are normal. Pupils are equal, round, and reactive to  light. Left conjunctiva has a hemorrhage.  Neck: Normal range of motion. Neck supple.  Cardiovascular: Normal rate, regular rhythm, normal heart sounds and intact distal pulses.   Pulmonary/Chest: Effort normal and breath sounds normal.  Abdominal: Soft. Bowel sounds are normal.  Musculoskeletal: Normal range of motion.  Neurological: He is alert and oriented to person, place, and time.  Skin: Skin is warm.  Psychiatric: He has a normal mood and affect. His behavior is normal. Judgment and thought content normal.  Nursing note and vitals reviewed.    ED Treatments / Results  Labs (all labs ordered are listed, but only abnormal results are displayed) Labs Reviewed  PROTIME-INR - Abnormal; Notable for the following:       Result Value   Prothrombin Time 24.1  (*)    All other components within normal limits    EKG  EKG Interpretation None       Radiology No results found.  Procedures Procedures (including critical care time)  Medications Ordered in ED Medications - No data to display   Initial Impression / Assessment and Plan / ED Course  I have reviewed the triage vital signs and the nursing notes.  Pertinent labs & imaging results that were available during my care of the patient were reviewed by me and considered in my medical decision making (see chart for details).  Clinical Course  INR is ok.  Visual acuity is ok.  Pt to be d/c'd home.   Visual Acuity  Right Eye Distance: 20/40 Left Eye Distance: 20/40 Bilateral Distance: 20/40 with glasses  Right Eye Near:   Left Eye Near:    Bilateral Near:     Final Clinical Impressions(s) / ED Diagnoses   Final diagnoses:  Subconjunctival hemorrhage of left eye    New Prescriptions New Prescriptions   No medications on file     [name], MD 03/12/16 1742

## 2016-03-12 NOTE — ED Triage Notes (Signed)
Reports of blood vessel burst in left eye since 1300 today. Is taking Coumadin currently. Denies any symptoms.

## 2016-04-24 DIAGNOSIS — Z7901 Long term (current) use of anticoagulants: Secondary | ICD-10-CM | POA: Diagnosis not present

## 2016-05-27 DIAGNOSIS — Z7901 Long term (current) use of anticoagulants: Secondary | ICD-10-CM | POA: Diagnosis not present

## 2016-06-27 DIAGNOSIS — Z7901 Long term (current) use of anticoagulants: Secondary | ICD-10-CM | POA: Diagnosis not present

## 2016-07-25 DIAGNOSIS — Z7901 Long term (current) use of anticoagulants: Secondary | ICD-10-CM | POA: Diagnosis not present

## 2016-08-19 DIAGNOSIS — Z7901 Long term (current) use of anticoagulants: Secondary | ICD-10-CM | POA: Diagnosis not present

## 2016-11-13 DIAGNOSIS — M1A00X0 Idiopathic chronic gout, unspecified site, without tophus (tophi): Secondary | ICD-10-CM | POA: Diagnosis not present

## 2016-11-13 DIAGNOSIS — Z1389 Encounter for screening for other disorder: Secondary | ICD-10-CM | POA: Diagnosis not present

## 2016-11-13 DIAGNOSIS — E782 Mixed hyperlipidemia: Secondary | ICD-10-CM | POA: Diagnosis not present

## 2016-11-13 DIAGNOSIS — Z6826 Body mass index (BMI) 26.0-26.9, adult: Secondary | ICD-10-CM | POA: Diagnosis not present

## 2016-12-18 DIAGNOSIS — Z7901 Long term (current) use of anticoagulants: Secondary | ICD-10-CM | POA: Diagnosis not present

## 2017-02-03 DIAGNOSIS — Z7901 Long term (current) use of anticoagulants: Secondary | ICD-10-CM | POA: Diagnosis not present

## 2017-03-05 IMAGING — DX DG SHOULDER 2+V*R*
2 series · 2 of 2 positions shown · non-contrast
Comparison: None.

CLINICAL DATA: Shoulder pain.  Fall.  Initial evaluation.

EXAM:
RIGHT SHOULDER - 2+ VIEW

[shoulder grashey]
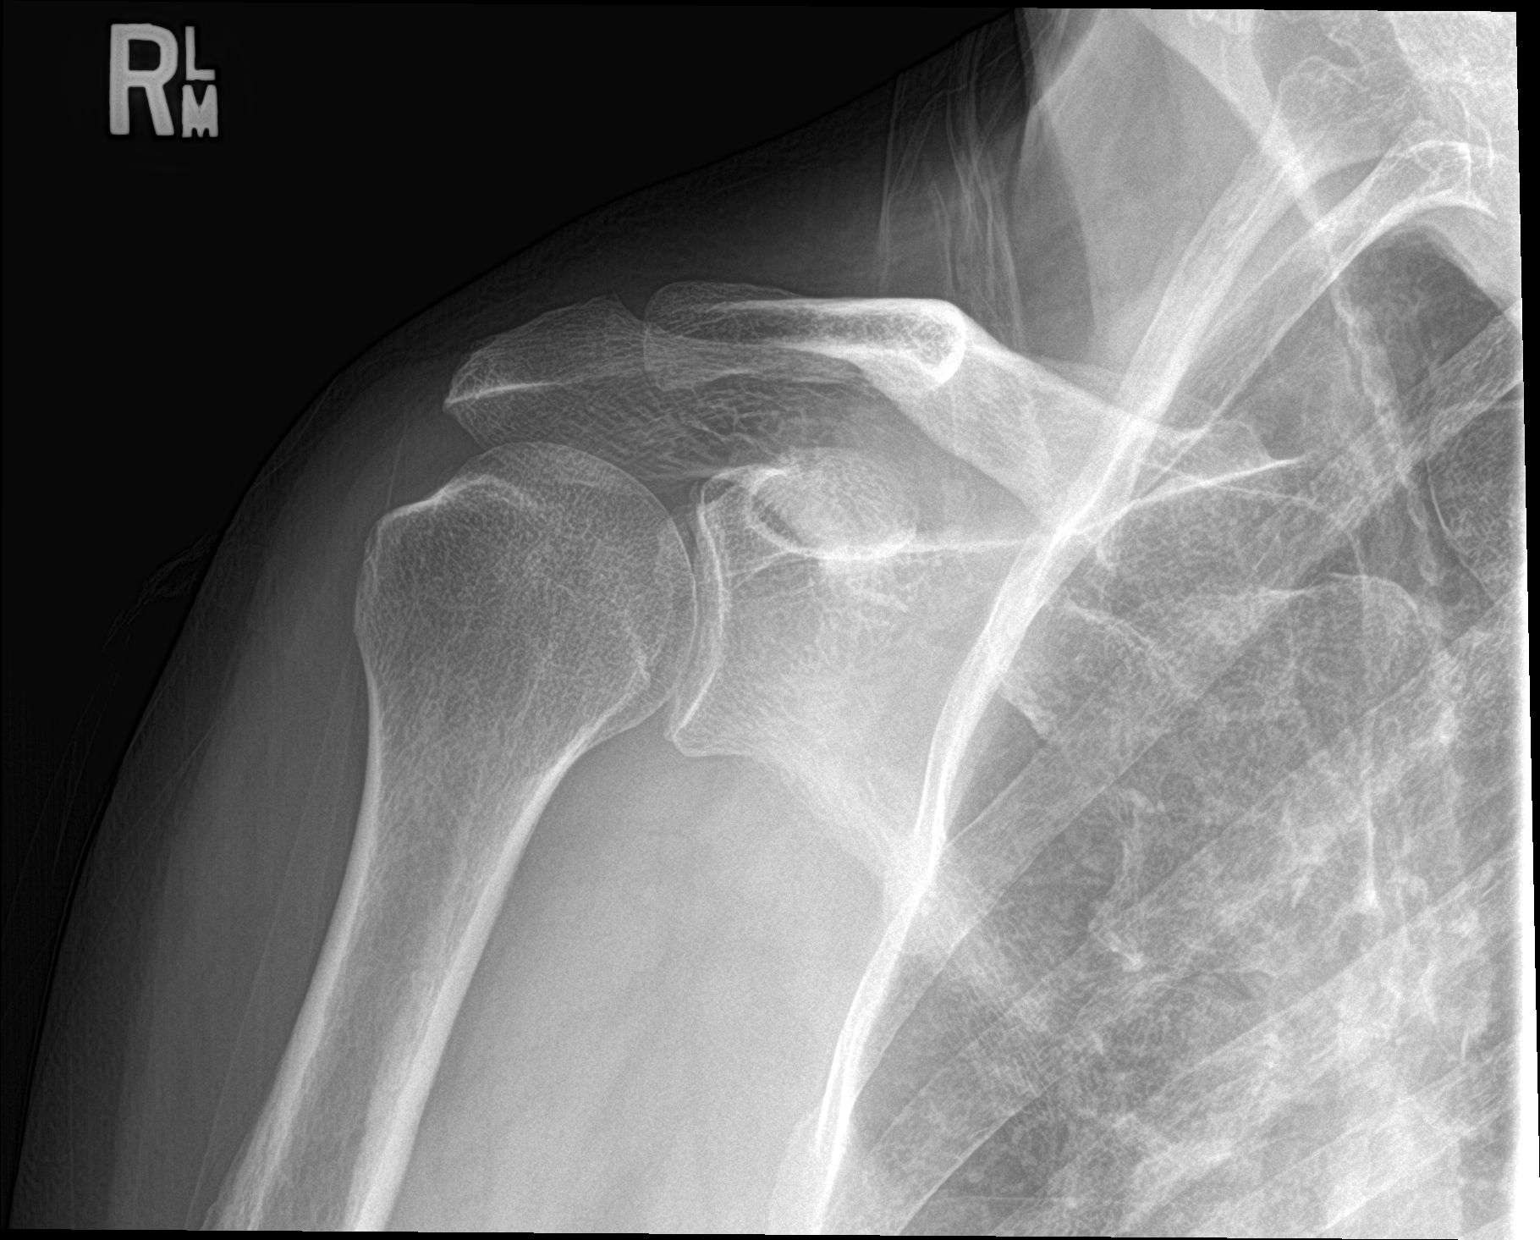

[shoulder y view]
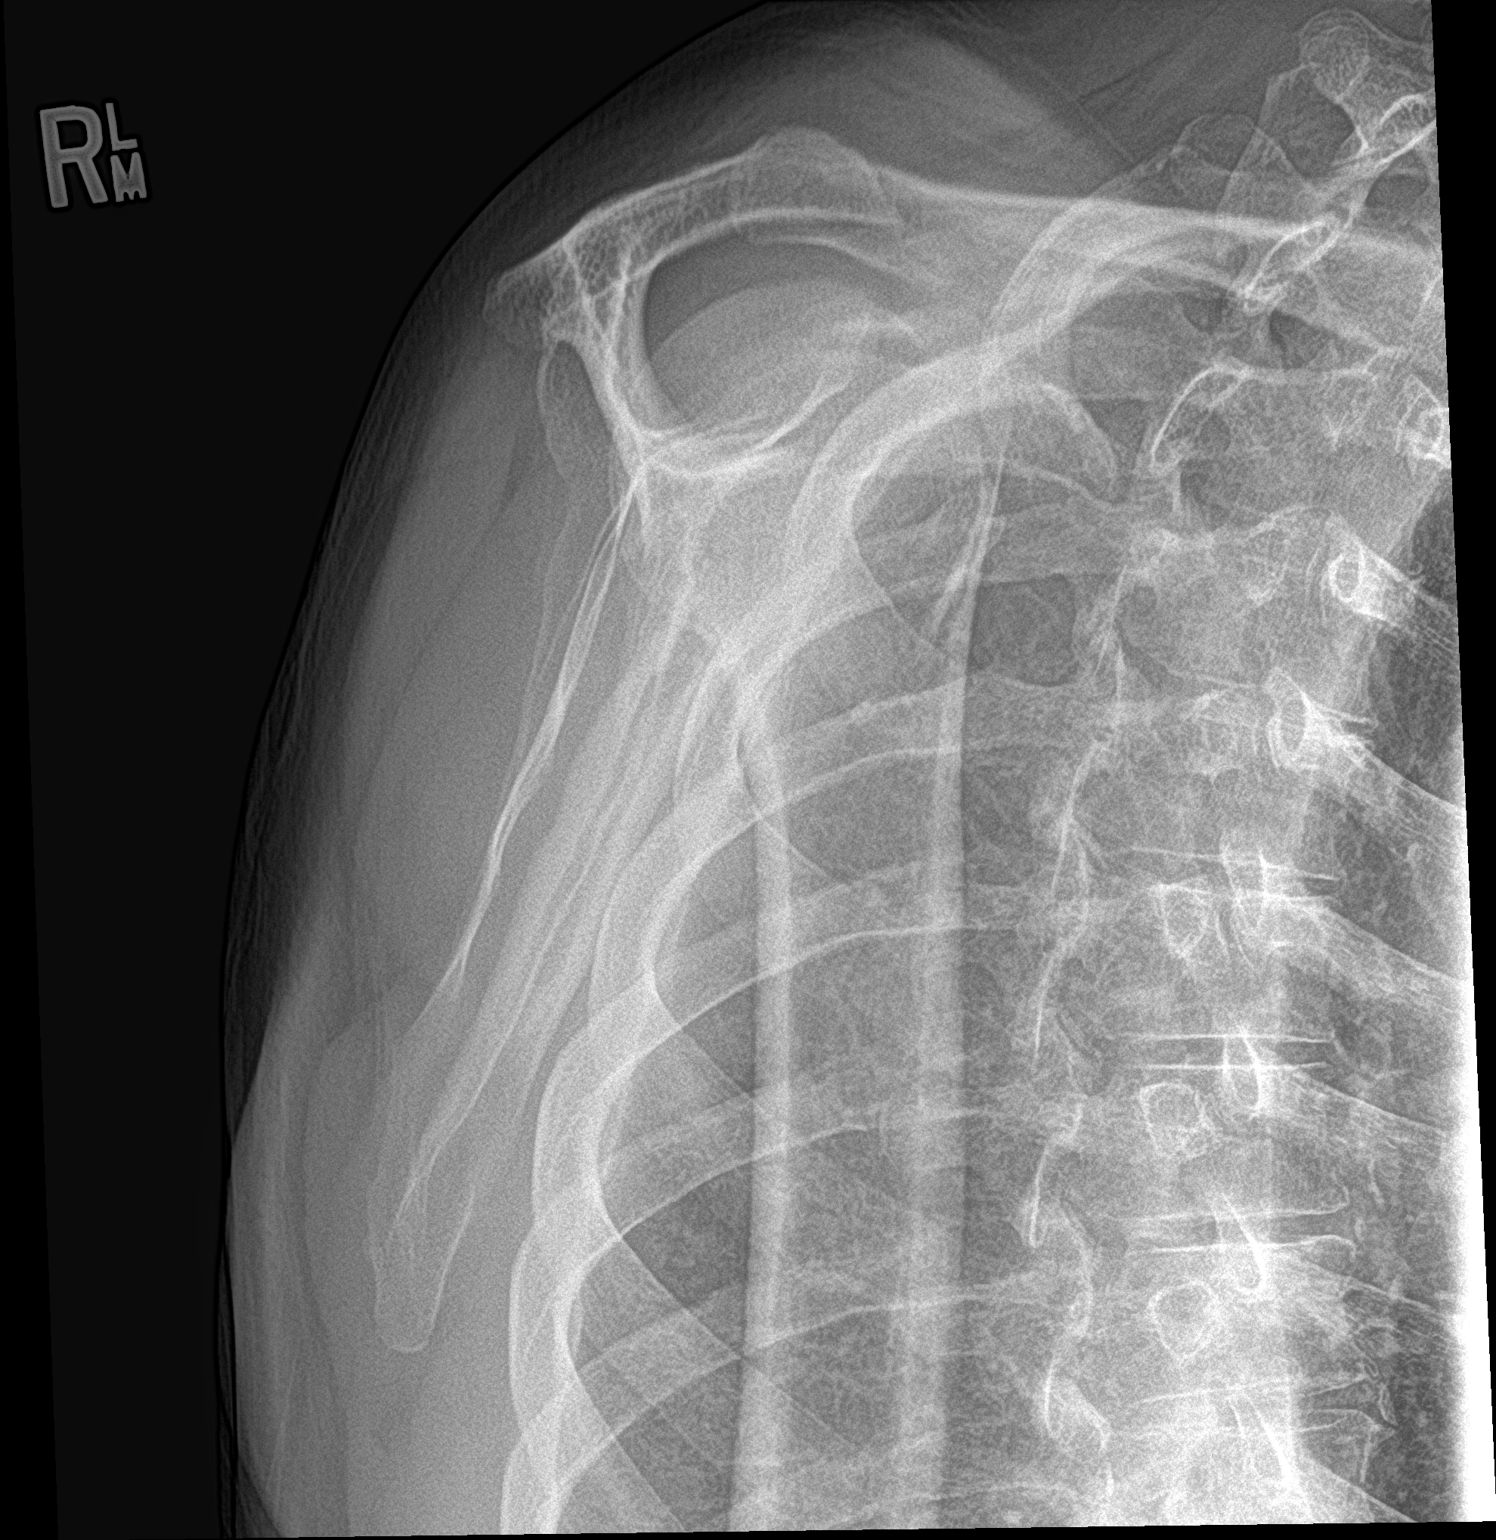

[2 of 2 positions shown; findings below may reference images not displayed]

FINDINGS: No acute bony or joint abnormality identified. No evidence of
fracture or dislocation.
IMPRESSION: No acute abnormality.

## 2017-03-20 DIAGNOSIS — Z7901 Long term (current) use of anticoagulants: Secondary | ICD-10-CM | POA: Diagnosis not present

## 2017-05-20 DIAGNOSIS — Z7901 Long term (current) use of anticoagulants: Secondary | ICD-10-CM | POA: Diagnosis not present

## 2017-06-23 DIAGNOSIS — Z7901 Long term (current) use of anticoagulants: Secondary | ICD-10-CM | POA: Diagnosis not present

## 2017-07-22 DIAGNOSIS — Z7901 Long term (current) use of anticoagulants: Secondary | ICD-10-CM | POA: Diagnosis not present

## 2017-08-25 DIAGNOSIS — I82409 Acute embolism and thrombosis of unspecified deep veins of unspecified lower extremity: Secondary | ICD-10-CM | POA: Diagnosis not present

## 2017-09-02 DIAGNOSIS — I82409 Acute embolism and thrombosis of unspecified deep veins of unspecified lower extremity: Secondary | ICD-10-CM | POA: Diagnosis not present

## 2017-09-21 DIAGNOSIS — I82409 Acute embolism and thrombosis of unspecified deep veins of unspecified lower extremity: Secondary | ICD-10-CM | POA: Diagnosis not present

## 2017-10-20 DIAGNOSIS — I82409 Acute embolism and thrombosis of unspecified deep veins of unspecified lower extremity: Secondary | ICD-10-CM | POA: Diagnosis not present

## 2017-10-23 DIAGNOSIS — R69 Illness, unspecified: Secondary | ICD-10-CM | POA: Diagnosis not present

## 2017-10-23 DIAGNOSIS — Z0001 Encounter for general adult medical examination with abnormal findings: Secondary | ICD-10-CM | POA: Diagnosis not present

## 2017-10-23 DIAGNOSIS — I2510 Atherosclerotic heart disease of native coronary artery without angina pectoris: Secondary | ICD-10-CM | POA: Diagnosis not present

## 2017-10-23 DIAGNOSIS — L309 Dermatitis, unspecified: Secondary | ICD-10-CM | POA: Diagnosis not present

## 2017-10-23 DIAGNOSIS — I10 Essential (primary) hypertension: Secondary | ICD-10-CM | POA: Diagnosis not present

## 2017-10-23 DIAGNOSIS — I82409 Acute embolism and thrombosis of unspecified deep veins of unspecified lower extremity: Secondary | ICD-10-CM | POA: Diagnosis not present

## 2017-10-23 DIAGNOSIS — E663 Overweight: Secondary | ICD-10-CM | POA: Diagnosis not present

## 2017-10-23 DIAGNOSIS — Z6828 Body mass index (BMI) 28.0-28.9, adult: Secondary | ICD-10-CM | POA: Diagnosis not present

## 2017-11-20 DIAGNOSIS — I82409 Acute embolism and thrombosis of unspecified deep veins of unspecified lower extremity: Secondary | ICD-10-CM | POA: Diagnosis not present

## 2017-12-22 DIAGNOSIS — I82409 Acute embolism and thrombosis of unspecified deep veins of unspecified lower extremity: Secondary | ICD-10-CM | POA: Diagnosis not present

## 2018-01-26 DIAGNOSIS — I82409 Acute embolism and thrombosis of unspecified deep veins of unspecified lower extremity: Secondary | ICD-10-CM | POA: Diagnosis not present

## 2018-02-24 DIAGNOSIS — I82409 Acute embolism and thrombosis of unspecified deep veins of unspecified lower extremity: Secondary | ICD-10-CM | POA: Diagnosis not present

## 2018-03-23 DIAGNOSIS — I82409 Acute embolism and thrombosis of unspecified deep veins of unspecified lower extremity: Secondary | ICD-10-CM | POA: Diagnosis not present

## 2018-05-11 DIAGNOSIS — I82409 Acute embolism and thrombosis of unspecified deep veins of unspecified lower extremity: Secondary | ICD-10-CM | POA: Diagnosis not present

## 2018-06-25 DIAGNOSIS — I82409 Acute embolism and thrombosis of unspecified deep veins of unspecified lower extremity: Secondary | ICD-10-CM | POA: Diagnosis not present

## 2018-07-14 DIAGNOSIS — I82409 Acute embolism and thrombosis of unspecified deep veins of unspecified lower extremity: Secondary | ICD-10-CM | POA: Diagnosis not present

## 2018-08-12 DIAGNOSIS — I82409 Acute embolism and thrombosis of unspecified deep veins of unspecified lower extremity: Secondary | ICD-10-CM | POA: Diagnosis not present

## 2018-09-03 DIAGNOSIS — I82409 Acute embolism and thrombosis of unspecified deep veins of unspecified lower extremity: Secondary | ICD-10-CM | POA: Diagnosis not present

## 2018-11-23 DIAGNOSIS — I82409 Acute embolism and thrombosis of unspecified deep veins of unspecified lower extremity: Secondary | ICD-10-CM | POA: Diagnosis not present

## 2018-12-22 DIAGNOSIS — I82409 Acute embolism and thrombosis of unspecified deep veins of unspecified lower extremity: Secondary | ICD-10-CM | POA: Diagnosis not present

## 2018-12-23 DIAGNOSIS — Z6827 Body mass index (BMI) 27.0-27.9, adult: Secondary | ICD-10-CM | POA: Diagnosis not present

## 2018-12-23 DIAGNOSIS — R7309 Other abnormal glucose: Secondary | ICD-10-CM | POA: Diagnosis not present

## 2018-12-23 DIAGNOSIS — I2510 Atherosclerotic heart disease of native coronary artery without angina pectoris: Secondary | ICD-10-CM | POA: Diagnosis not present

## 2018-12-23 DIAGNOSIS — Z0000 Encounter for general adult medical examination without abnormal findings: Secondary | ICD-10-CM | POA: Diagnosis not present

## 2018-12-23 DIAGNOSIS — Z1389 Encounter for screening for other disorder: Secondary | ICD-10-CM | POA: Diagnosis not present

## 2018-12-23 DIAGNOSIS — Z0001 Encounter for general adult medical examination with abnormal findings: Secondary | ICD-10-CM | POA: Diagnosis not present

## 2018-12-23 DIAGNOSIS — E7849 Other hyperlipidemia: Secondary | ICD-10-CM | POA: Diagnosis not present

## 2018-12-23 DIAGNOSIS — I10 Essential (primary) hypertension: Secondary | ICD-10-CM | POA: Diagnosis not present

## 2018-12-23 DIAGNOSIS — E782 Mixed hyperlipidemia: Secondary | ICD-10-CM | POA: Diagnosis not present

## 2019-02-17 DIAGNOSIS — I82409 Acute embolism and thrombosis of unspecified deep veins of unspecified lower extremity: Secondary | ICD-10-CM | POA: Diagnosis not present

## 2019-03-17 DIAGNOSIS — I82409 Acute embolism and thrombosis of unspecified deep veins of unspecified lower extremity: Secondary | ICD-10-CM | POA: Diagnosis not present

## 2019-04-20 DIAGNOSIS — I82409 Acute embolism and thrombosis of unspecified deep veins of unspecified lower extremity: Secondary | ICD-10-CM | POA: Diagnosis not present

## 2019-05-25 DIAGNOSIS — I82409 Acute embolism and thrombosis of unspecified deep veins of unspecified lower extremity: Secondary | ICD-10-CM | POA: Diagnosis not present

## 2019-07-19 DIAGNOSIS — I82409 Acute embolism and thrombosis of unspecified deep veins of unspecified lower extremity: Secondary | ICD-10-CM | POA: Diagnosis not present

## 2019-08-03 DIAGNOSIS — I82409 Acute embolism and thrombosis of unspecified deep veins of unspecified lower extremity: Secondary | ICD-10-CM | POA: Diagnosis not present

## 2019-08-26 DIAGNOSIS — I82409 Acute embolism and thrombosis of unspecified deep veins of unspecified lower extremity: Secondary | ICD-10-CM | POA: Diagnosis not present

## 2019-09-21 DIAGNOSIS — I82409 Acute embolism and thrombosis of unspecified deep veins of unspecified lower extremity: Secondary | ICD-10-CM | POA: Diagnosis not present

## 2019-10-27 DIAGNOSIS — I82409 Acute embolism and thrombosis of unspecified deep veins of unspecified lower extremity: Secondary | ICD-10-CM | POA: Diagnosis not present

## 2019-11-25 DIAGNOSIS — I82409 Acute embolism and thrombosis of unspecified deep veins of unspecified lower extremity: Secondary | ICD-10-CM | POA: Diagnosis not present

## 2019-12-01 DIAGNOSIS — E663 Overweight: Secondary | ICD-10-CM | POA: Diagnosis not present

## 2019-12-01 DIAGNOSIS — Z6827 Body mass index (BMI) 27.0-27.9, adult: Secondary | ICD-10-CM | POA: Diagnosis not present

## 2019-12-01 DIAGNOSIS — L309 Dermatitis, unspecified: Secondary | ICD-10-CM | POA: Diagnosis not present

## 2019-12-27 DIAGNOSIS — Z1389 Encounter for screening for other disorder: Secondary | ICD-10-CM | POA: Diagnosis not present

## 2019-12-27 DIAGNOSIS — Z6827 Body mass index (BMI) 27.0-27.9, adult: Secondary | ICD-10-CM | POA: Diagnosis not present

## 2019-12-27 DIAGNOSIS — Z0000 Encounter for general adult medical examination without abnormal findings: Secondary | ICD-10-CM | POA: Diagnosis not present

## 2019-12-27 DIAGNOSIS — R7309 Other abnormal glucose: Secondary | ICD-10-CM | POA: Diagnosis not present

## 2019-12-27 DIAGNOSIS — E119 Type 2 diabetes mellitus without complications: Secondary | ICD-10-CM | POA: Diagnosis not present

## 2019-12-27 DIAGNOSIS — Z719 Counseling, unspecified: Secondary | ICD-10-CM | POA: Diagnosis not present

## 2019-12-27 DIAGNOSIS — I82409 Acute embolism and thrombosis of unspecified deep veins of unspecified lower extremity: Secondary | ICD-10-CM | POA: Diagnosis not present

## 2019-12-27 DIAGNOSIS — E7849 Other hyperlipidemia: Secondary | ICD-10-CM | POA: Diagnosis not present

## 2019-12-27 DIAGNOSIS — E663 Overweight: Secondary | ICD-10-CM | POA: Diagnosis not present

## 2019-12-27 DIAGNOSIS — Z0001 Encounter for general adult medical examination with abnormal findings: Secondary | ICD-10-CM | POA: Diagnosis not present

## 2020-02-02 DIAGNOSIS — I82409 Acute embolism and thrombosis of unspecified deep veins of unspecified lower extremity: Secondary | ICD-10-CM | POA: Diagnosis not present

## 2020-03-02 DIAGNOSIS — I82409 Acute embolism and thrombosis of unspecified deep veins of unspecified lower extremity: Secondary | ICD-10-CM | POA: Diagnosis not present

## 2020-03-22 DIAGNOSIS — Z79899 Other long term (current) drug therapy: Secondary | ICD-10-CM | POA: Diagnosis not present

## 2020-05-15 DIAGNOSIS — Z79899 Other long term (current) drug therapy: Secondary | ICD-10-CM | POA: Diagnosis not present

## 2020-06-28 DIAGNOSIS — Z79899 Other long term (current) drug therapy: Secondary | ICD-10-CM | POA: Diagnosis not present

## 2020-07-04 DIAGNOSIS — Z20822 Contact with and (suspected) exposure to covid-19: Secondary | ICD-10-CM | POA: Diagnosis not present

## 2020-07-12 DIAGNOSIS — U071 COVID-19: Secondary | ICD-10-CM | POA: Diagnosis not present

## 2020-07-17 ENCOUNTER — Other Ambulatory Visit: Payer: Self-pay

## 2020-07-17 ENCOUNTER — Ambulatory Visit (HOSPITAL_COMMUNITY)
Admission: RE | Admit: 2020-07-17 | Discharge: 2020-07-17 | Disposition: A | Discharge status: Home / Self Care | Payer: Medicare HMO | Source: Ambulatory Visit | Attending: Internal Medicine | Admitting: Internal Medicine

## 2020-07-17 ENCOUNTER — Other Ambulatory Visit (HOSPITAL_COMMUNITY): Payer: Self-pay | Admitting: Internal Medicine

## 2020-07-17 DIAGNOSIS — Z1331 Encounter for screening for depression: Secondary | ICD-10-CM | POA: Diagnosis not present

## 2020-07-17 DIAGNOSIS — R052 Subacute cough: Secondary | ICD-10-CM | POA: Insufficient documentation

## 2020-07-17 DIAGNOSIS — R0602 Shortness of breath: Secondary | ICD-10-CM | POA: Diagnosis not present

## 2020-07-17 DIAGNOSIS — I951 Orthostatic hypotension: Secondary | ICD-10-CM | POA: Diagnosis not present

## 2020-07-17 DIAGNOSIS — I82409 Acute embolism and thrombosis of unspecified deep veins of unspecified lower extremity: Secondary | ICD-10-CM | POA: Diagnosis not present

## 2020-07-17 DIAGNOSIS — E860 Dehydration: Secondary | ICD-10-CM | POA: Diagnosis not present

## 2020-07-17 DIAGNOSIS — Z6826 Body mass index (BMI) 26.0-26.9, adult: Secondary | ICD-10-CM | POA: Diagnosis not present

## 2020-07-17 DIAGNOSIS — I10 Essential (primary) hypertension: Secondary | ICD-10-CM | POA: Diagnosis not present

## 2020-07-17 DIAGNOSIS — R7301 Impaired fasting glucose: Secondary | ICD-10-CM | POA: Diagnosis not present

## 2020-07-17 DIAGNOSIS — U099 Post covid-19 condition, unspecified: Secondary | ICD-10-CM | POA: Diagnosis not present

## 2020-07-17 DIAGNOSIS — R509 Fever, unspecified: Secondary | ICD-10-CM | POA: Diagnosis not present

## 2020-07-17 DIAGNOSIS — R059 Cough, unspecified: Secondary | ICD-10-CM | POA: Diagnosis not present

## 2020-07-17 DIAGNOSIS — R55 Syncope and collapse: Secondary | ICD-10-CM | POA: Diagnosis not present

## 2020-08-31 DIAGNOSIS — Z79899 Other long term (current) drug therapy: Secondary | ICD-10-CM | POA: Diagnosis not present

## 2020-09-27 DIAGNOSIS — I82409 Acute embolism and thrombosis of unspecified deep veins of unspecified lower extremity: Secondary | ICD-10-CM | POA: Diagnosis not present

## 2020-10-26 DIAGNOSIS — Z79899 Other long term (current) drug therapy: Secondary | ICD-10-CM | POA: Diagnosis not present

## 2021-01-16 DIAGNOSIS — Z79899 Other long term (current) drug therapy: Secondary | ICD-10-CM | POA: Diagnosis not present

## 2021-01-21 DIAGNOSIS — Z6826 Body mass index (BMI) 26.0-26.9, adult: Secondary | ICD-10-CM | POA: Diagnosis not present

## 2021-01-21 DIAGNOSIS — M7742 Metatarsalgia, left foot: Secondary | ICD-10-CM | POA: Diagnosis not present

## 2021-01-21 DIAGNOSIS — E663 Overweight: Secondary | ICD-10-CM | POA: Diagnosis not present

## 2021-01-21 DIAGNOSIS — Z0000 Encounter for general adult medical examination without abnormal findings: Secondary | ICD-10-CM | POA: Diagnosis not present

## 2021-01-21 DIAGNOSIS — Z1331 Encounter for screening for depression: Secondary | ICD-10-CM | POA: Diagnosis not present

## 2021-01-21 DIAGNOSIS — B079 Viral wart, unspecified: Secondary | ICD-10-CM | POA: Diagnosis not present

## 2021-04-05 DIAGNOSIS — I82409 Acute embolism and thrombosis of unspecified deep veins of unspecified lower extremity: Secondary | ICD-10-CM | POA: Diagnosis not present

## 2021-04-11 DIAGNOSIS — E663 Overweight: Secondary | ICD-10-CM | POA: Diagnosis not present

## 2021-04-11 DIAGNOSIS — Z6827 Body mass index (BMI) 27.0-27.9, adult: Secondary | ICD-10-CM | POA: Diagnosis not present

## 2021-04-11 DIAGNOSIS — G4700 Insomnia, unspecified: Secondary | ICD-10-CM | POA: Diagnosis not present

## 2021-08-09 DIAGNOSIS — Z79899 Other long term (current) drug therapy: Secondary | ICD-10-CM | POA: Diagnosis not present

## 2021-09-09 DIAGNOSIS — Z79899 Other long term (current) drug therapy: Secondary | ICD-10-CM | POA: Diagnosis not present

## 2021-11-05 DIAGNOSIS — Z79899 Other long term (current) drug therapy: Secondary | ICD-10-CM | POA: Diagnosis not present

## 2021-11-25 DIAGNOSIS — Z79899 Other long term (current) drug therapy: Secondary | ICD-10-CM | POA: Diagnosis not present

## 2022-01-09 DIAGNOSIS — Z79899 Other long term (current) drug therapy: Secondary | ICD-10-CM | POA: Diagnosis not present

## 2022-02-10 DIAGNOSIS — D518 Other vitamin B12 deficiency anemias: Secondary | ICD-10-CM | POA: Diagnosis not present

## 2022-02-10 DIAGNOSIS — M79672 Pain in left foot: Secondary | ICD-10-CM | POA: Diagnosis not present

## 2022-02-10 DIAGNOSIS — E039 Hypothyroidism, unspecified: Secondary | ICD-10-CM | POA: Diagnosis not present

## 2022-02-10 DIAGNOSIS — E559 Vitamin D deficiency, unspecified: Secondary | ICD-10-CM | POA: Diagnosis not present

## 2022-02-10 DIAGNOSIS — Z0001 Encounter for general adult medical examination with abnormal findings: Secondary | ICD-10-CM | POA: Diagnosis not present

## 2022-02-10 DIAGNOSIS — Z125 Encounter for screening for malignant neoplasm of prostate: Secondary | ICD-10-CM | POA: Diagnosis not present

## 2022-02-10 DIAGNOSIS — Z1331 Encounter for screening for depression: Secondary | ICD-10-CM | POA: Diagnosis not present

## 2022-02-10 DIAGNOSIS — Z136 Encounter for screening for cardiovascular disorders: Secondary | ICD-10-CM | POA: Diagnosis not present

## 2022-02-10 DIAGNOSIS — I10 Essential (primary) hypertension: Secondary | ICD-10-CM | POA: Diagnosis not present

## 2022-02-10 DIAGNOSIS — R7309 Other abnormal glucose: Secondary | ICD-10-CM | POA: Diagnosis not present

## 2022-02-10 DIAGNOSIS — Z6826 Body mass index (BMI) 26.0-26.9, adult: Secondary | ICD-10-CM | POA: Diagnosis not present

## 2022-02-10 DIAGNOSIS — Z79899 Other long term (current) drug therapy: Secondary | ICD-10-CM | POA: Diagnosis not present

## 2022-02-10 DIAGNOSIS — I872 Venous insufficiency (chronic) (peripheral): Secondary | ICD-10-CM | POA: Diagnosis not present

## 2022-04-01 IMAGING — DX DG CHEST 2V
2 series · 2 of 2 positions shown · non-contrast
Comparison: None

CLINICAL DATA: Intermittent shortness of breath and cough post
COVID 07/04/2020

EXAM:
CHEST - 2 VIEW

[chest pa]
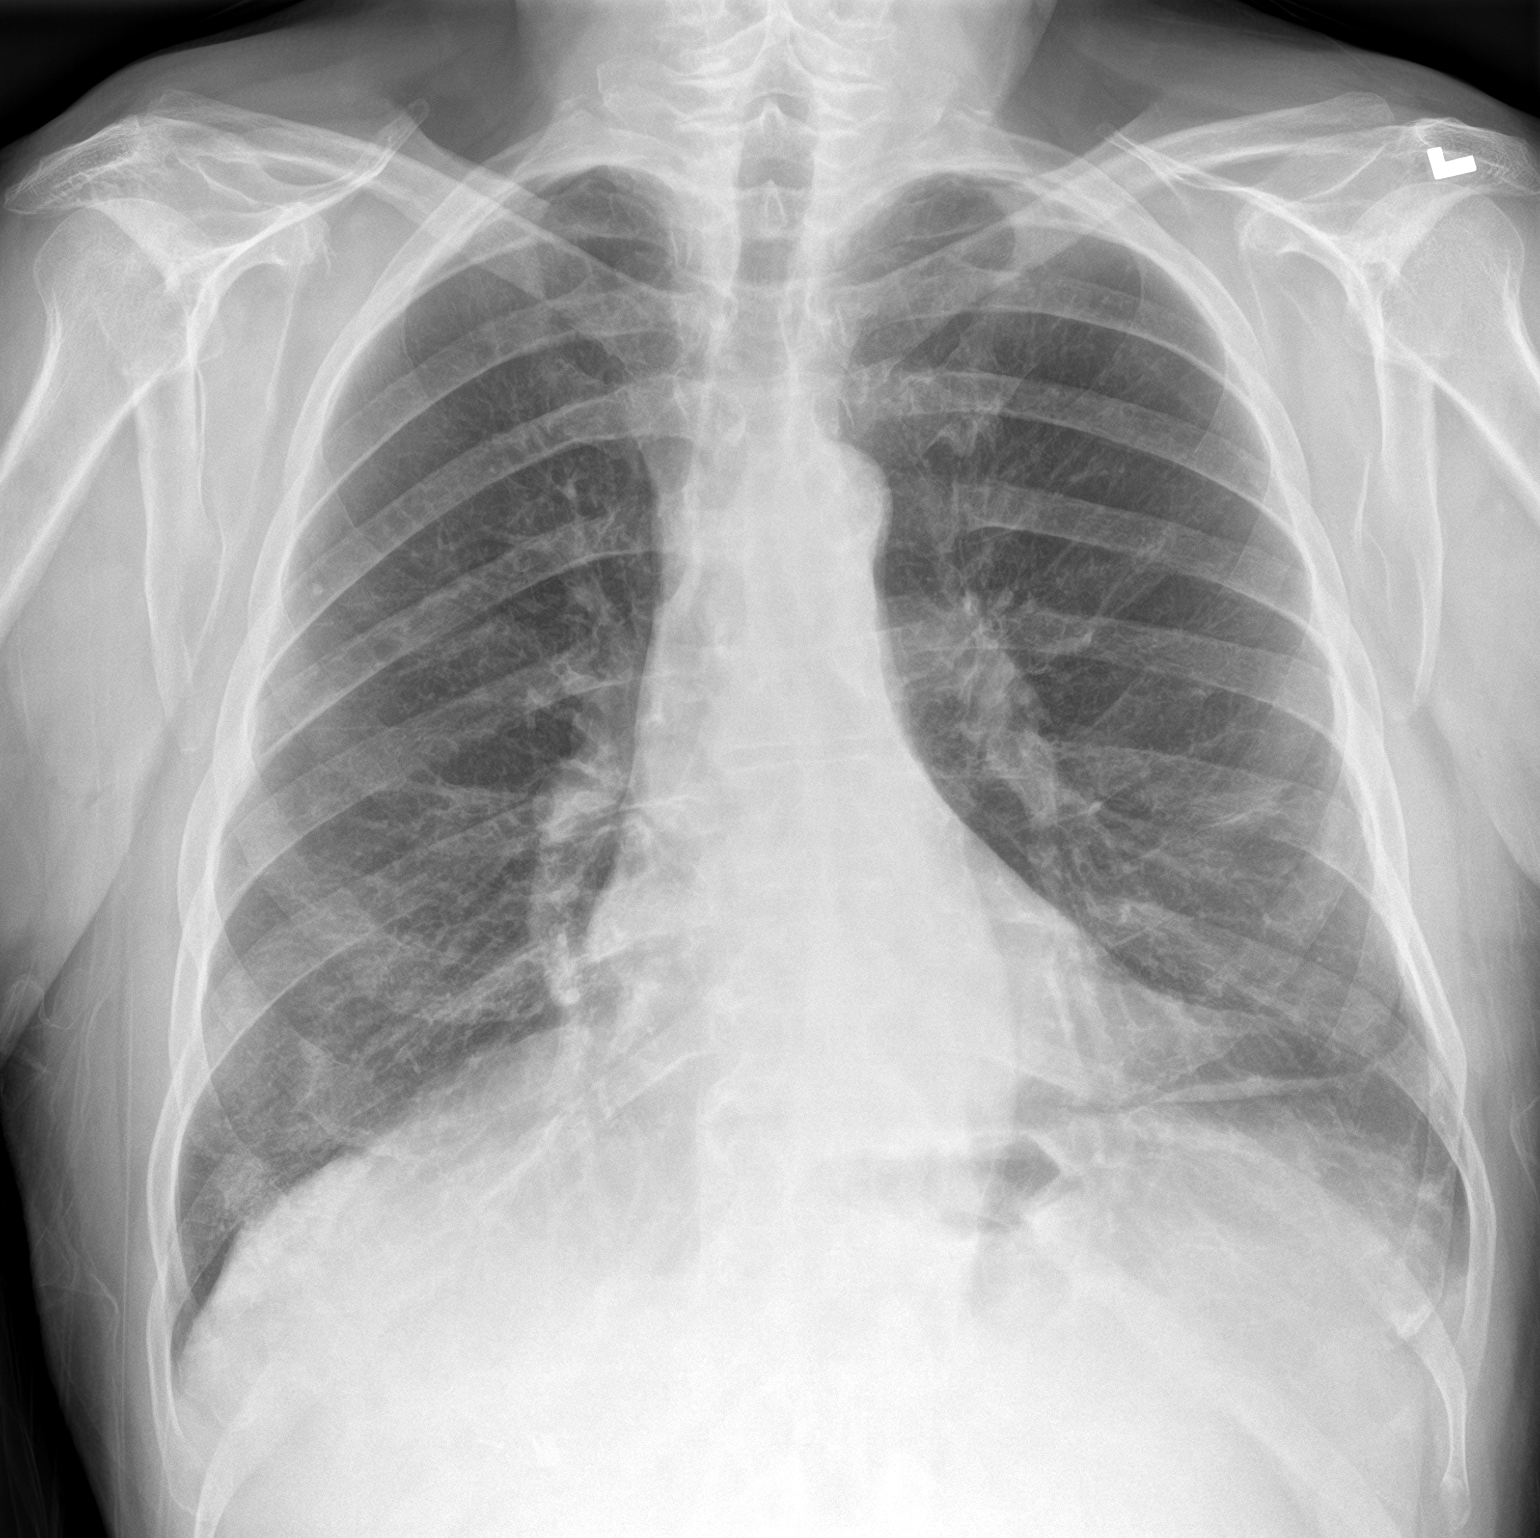

[chest lat]
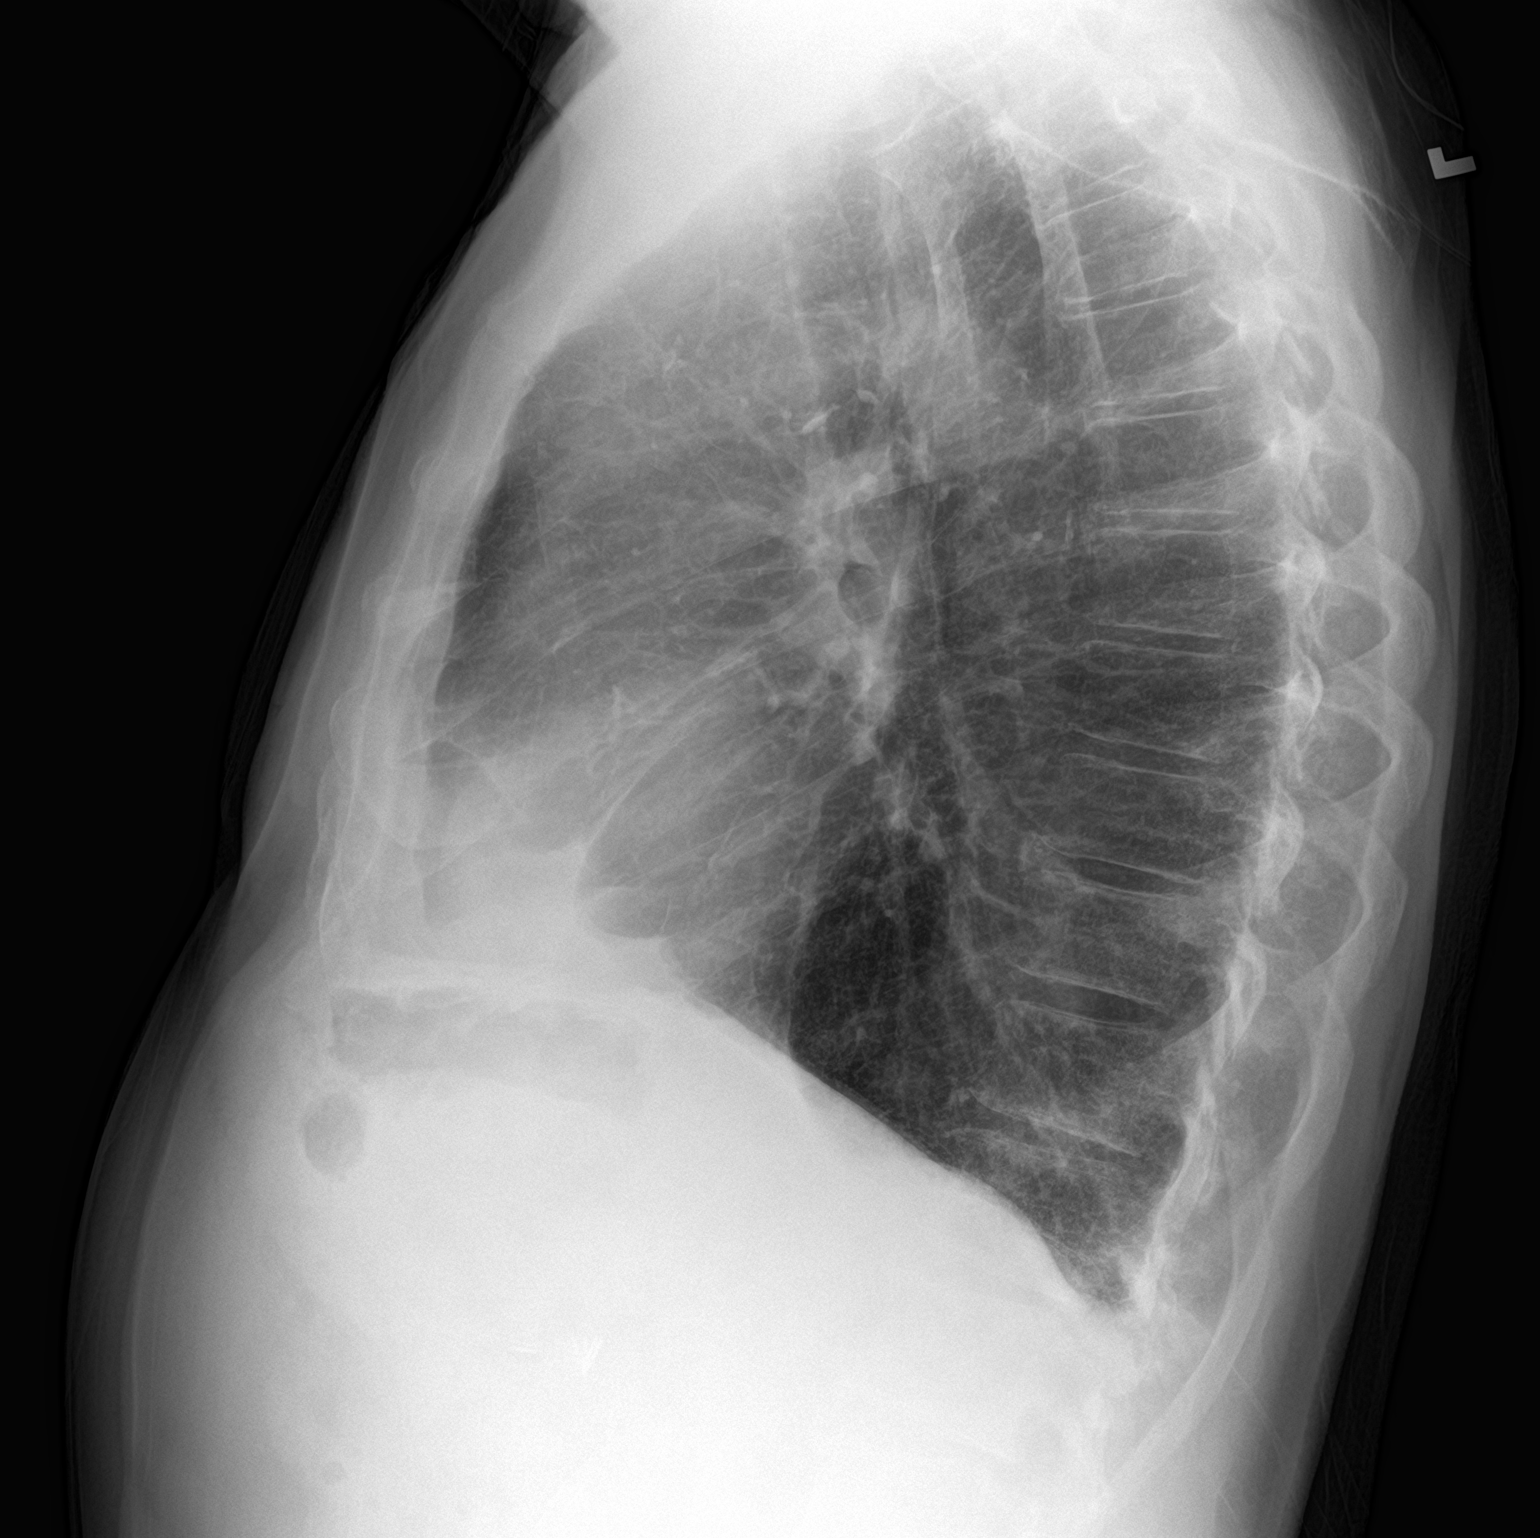

[2 of 2 positions shown; findings below may reference images not displayed]

FINDINGS: Some coarse and patchy opacities are present in the mid to lower
lungs which could reflect some residual infection/inflammation or
post inflammatory sequela/scarring. No convincing features of edema.
No pneumothorax or visible effusions. The aorta is calcified. The
remaining cardiomediastinal contours are unremarkable. No acute
osseous or soft tissue abnormality. Degenerative changes are present
in the imaged spine and shoulders.
IMPRESSION: Some faint ill-defined coarse and patchy opacities in the mid to
lower lungs which could reflect some residual infection/inflammation
or post inflammatory sequela/scarring.

Aortic Atherosclerosis (3EEJG-P62.2).

## 2022-06-02 DIAGNOSIS — M19071 Primary osteoarthritis, right ankle and foot: Secondary | ICD-10-CM | POA: Diagnosis not present

## 2022-06-02 DIAGNOSIS — M205X2 Other deformities of toe(s) (acquired), left foot: Secondary | ICD-10-CM | POA: Diagnosis not present

## 2022-06-02 DIAGNOSIS — M792 Neuralgia and neuritis, unspecified: Secondary | ICD-10-CM | POA: Diagnosis not present

## 2022-06-02 DIAGNOSIS — L909 Atrophic disorder of skin, unspecified: Secondary | ICD-10-CM | POA: Diagnosis not present

## 2022-06-02 DIAGNOSIS — M7742 Metatarsalgia, left foot: Secondary | ICD-10-CM | POA: Diagnosis not present

## 2022-06-02 DIAGNOSIS — M19072 Primary osteoarthritis, left ankle and foot: Secondary | ICD-10-CM | POA: Diagnosis not present

## 2022-06-02 DIAGNOSIS — L851 Acquired keratosis [keratoderma] palmaris et plantaris: Secondary | ICD-10-CM | POA: Diagnosis not present

## 2022-10-29 DIAGNOSIS — M79672 Pain in left foot: Secondary | ICD-10-CM | POA: Diagnosis not present

## 2022-10-29 DIAGNOSIS — M79671 Pain in right foot: Secondary | ICD-10-CM | POA: Diagnosis not present

## 2022-11-18 DIAGNOSIS — M79672 Pain in left foot: Secondary | ICD-10-CM | POA: Diagnosis not present

## 2022-11-18 DIAGNOSIS — E663 Overweight: Secondary | ICD-10-CM | POA: Diagnosis not present

## 2022-11-18 DIAGNOSIS — Z6826 Body mass index (BMI) 26.0-26.9, adult: Secondary | ICD-10-CM | POA: Diagnosis not present

## 2022-11-18 DIAGNOSIS — G5753 Tarsal tunnel syndrome, bilateral lower limbs: Secondary | ICD-10-CM | POA: Diagnosis not present

## 2022-11-18 DIAGNOSIS — Z125 Encounter for screening for malignant neoplasm of prostate: Secondary | ICD-10-CM | POA: Diagnosis not present

## 2022-11-18 DIAGNOSIS — I872 Venous insufficiency (chronic) (peripheral): Secondary | ICD-10-CM | POA: Diagnosis not present

## 2022-11-18 DIAGNOSIS — I10 Essential (primary) hypertension: Secondary | ICD-10-CM | POA: Diagnosis not present

## 2022-11-18 DIAGNOSIS — Z79899 Other long term (current) drug therapy: Secondary | ICD-10-CM | POA: Diagnosis not present

## 2022-11-18 DIAGNOSIS — Z0001 Encounter for general adult medical examination with abnormal findings: Secondary | ICD-10-CM | POA: Diagnosis not present

## 2022-11-19 DIAGNOSIS — M79671 Pain in right foot: Secondary | ICD-10-CM | POA: Diagnosis not present

## 2022-11-19 DIAGNOSIS — M79672 Pain in left foot: Secondary | ICD-10-CM | POA: Diagnosis not present

## 2022-12-11 ENCOUNTER — Ambulatory Visit (INDEPENDENT_AMBULATORY_CARE_PROVIDER_SITE_OTHER): Payer: Medicare HMO

## 2022-12-11 ENCOUNTER — Encounter: Payer: Self-pay | Admitting: Podiatry

## 2022-12-11 ENCOUNTER — Ambulatory Visit: Payer: Medicare HMO | Admitting: Podiatry

## 2022-12-11 DIAGNOSIS — M722 Plantar fascial fibromatosis: Secondary | ICD-10-CM

## 2022-12-11 DIAGNOSIS — M5416 Radiculopathy, lumbar region: Secondary | ICD-10-CM

## 2022-12-11 DIAGNOSIS — L84 Corns and callosities: Secondary | ICD-10-CM | POA: Diagnosis not present

## 2022-12-11 DIAGNOSIS — M7732 Calcaneal spur, left foot: Secondary | ICD-10-CM | POA: Diagnosis not present

## 2022-12-11 DIAGNOSIS — M79671 Pain in right foot: Secondary | ICD-10-CM | POA: Diagnosis not present

## 2022-12-11 DIAGNOSIS — M79672 Pain in left foot: Secondary | ICD-10-CM | POA: Diagnosis not present

## 2022-12-11 DIAGNOSIS — M7731 Calcaneal spur, right foot: Secondary | ICD-10-CM | POA: Diagnosis not present

## 2022-12-11 NOTE — Progress Notes (Signed)
  Subjective:  Patient ID: [name] [name], male    DOB: [birth year],   MRN: [id number]  Chief Complaint  Patient presents with   Numbness     Bilateral second toe numbness    Foot Pain     Bilateral heel pain painful when pressure is applied     77 y.o. male presents for concern of left heel pain that has been going on for two months as well as bilateral foot numbness up to his leg that has been going on for about that time as well. Also relates a history of slipped disc in his back and concerned this may be relates. Relates burning and tingling in his feet. Relates the left heel hurt when any pressure is applied relates a similar problem on the right years before but resolved after wife removed some skin . Denies any other pedal complaints. Denies n/v/f/c.   Past Medical History:  Diagnosis Date   Coronary artery disease    MI over 20 years ago.   DVT (deep venous thrombosis) ([facility]) 05/2012   once prior to 05/2012 as well. Had stopped coumadin and developed second clot.    High cholesterol     Objective:  Physical Exam: Vascular: DP/PT pulses 2/4 bilateral. CFT <3 seconds. Normal hair growth on digits. No edema.  Skin. No lacerations or abrasions bilateral feet. Hyperkeratotic cored tissues noted to plantar central left heel.  Musculoskeletal: MMT 5/5 bilateral lower extremities in DF, PF, Inversion and Eversion. Deceased ROM in DF of ankle joint. Mildly tender to medial calcaneal tubercle but most tender over callused area.  Neurological: Sensation intact to light touch.   Assessment:   1. Callus of heel   2. Plantar fasciitis of left foot   3. Lumbar radiculopathy      Plan:  Patient was evaluated and treated and all questions answered. -Discussed radiculopaththy vs plantar fasciitis vs calluses with patient and treatment options.  -Hyperkeratotic tissue was debrided with chisel without incident.  -Applied salycylic acid treatment to area with dressing. Advised to remove  bandaging tomorrow.  -Encouraged daily moisturizing -Discussed use of pumice stone -Advised good supportive shoes and inserts -Stretching exercises provided to help with possible PF.  Discussed following up with back specialist to evaluate for back problem contributing to foot numbness burning and tingling.  -Patient to return to office as needed or sooner if condition worsens.   Louann Sjogren, DPM

## 2022-12-11 NOTE — Patient Instructions (Signed)

## 2023-01-26 DIAGNOSIS — M79672 Pain in left foot: Secondary | ICD-10-CM | POA: Diagnosis not present

## 2023-01-26 DIAGNOSIS — G609 Hereditary and idiopathic neuropathy, unspecified: Secondary | ICD-10-CM | POA: Diagnosis not present

## 2023-01-26 DIAGNOSIS — M5416 Radiculopathy, lumbar region: Secondary | ICD-10-CM | POA: Diagnosis not present

## 2023-01-26 DIAGNOSIS — M79671 Pain in right foot: Secondary | ICD-10-CM | POA: Diagnosis not present

## 2023-02-03 DIAGNOSIS — E538 Deficiency of other specified B group vitamins: Secondary | ICD-10-CM | POA: Diagnosis not present

## 2023-02-03 DIAGNOSIS — G629 Polyneuropathy, unspecified: Secondary | ICD-10-CM | POA: Diagnosis not present

## 2023-02-04 DIAGNOSIS — I10 Essential (primary) hypertension: Secondary | ICD-10-CM | POA: Diagnosis not present

## 2023-02-04 DIAGNOSIS — D518 Other vitamin B12 deficiency anemias: Secondary | ICD-10-CM | POA: Diagnosis not present

## 2023-02-04 DIAGNOSIS — I872 Venous insufficiency (chronic) (peripheral): Secondary | ICD-10-CM | POA: Diagnosis not present

## 2023-02-04 DIAGNOSIS — G629 Polyneuropathy, unspecified: Secondary | ICD-10-CM | POA: Diagnosis not present

## 2023-02-20 DIAGNOSIS — U071 COVID-19: Secondary | ICD-10-CM | POA: Diagnosis not present

## 2023-02-20 DIAGNOSIS — M48061 Spinal stenosis, lumbar region without neurogenic claudication: Secondary | ICD-10-CM | POA: Diagnosis not present

## 2023-02-20 DIAGNOSIS — M5116 Intervertebral disc disorders with radiculopathy, lumbar region: Secondary | ICD-10-CM | POA: Diagnosis not present

## 2023-02-20 DIAGNOSIS — M4726 Other spondylosis with radiculopathy, lumbar region: Secondary | ICD-10-CM | POA: Diagnosis not present

## 2023-03-09 DIAGNOSIS — I7143 Infrarenal abdominal aortic aneurysm, without rupture: Secondary | ICD-10-CM | POA: Diagnosis not present

## 2023-03-09 DIAGNOSIS — I739 Peripheral vascular disease, unspecified: Secondary | ICD-10-CM | POA: Diagnosis not present

## 2023-03-23 DIAGNOSIS — R0602 Shortness of breath: Secondary | ICD-10-CM | POA: Diagnosis not present

## 2023-03-23 DIAGNOSIS — I739 Peripheral vascular disease, unspecified: Secondary | ICD-10-CM | POA: Diagnosis not present

## 2023-03-23 DIAGNOSIS — I714 Abdominal aortic aneurysm, without rupture, unspecified: Secondary | ICD-10-CM | POA: Diagnosis not present

## 2023-03-23 DIAGNOSIS — E785 Hyperlipidemia, unspecified: Secondary | ICD-10-CM | POA: Diagnosis not present

## 2023-03-23 DIAGNOSIS — Z720 Tobacco use: Secondary | ICD-10-CM | POA: Diagnosis not present

## 2023-03-23 DIAGNOSIS — I7143 Infrarenal abdominal aortic aneurysm, without rupture: Secondary | ICD-10-CM | POA: Diagnosis not present

## 2023-04-09 DIAGNOSIS — H35033 Hypertensive retinopathy, bilateral: Secondary | ICD-10-CM | POA: Diagnosis not present

## 2023-04-09 DIAGNOSIS — H524 Presbyopia: Secondary | ICD-10-CM | POA: Diagnosis not present

## 2023-04-14 DIAGNOSIS — I7143 Infrarenal abdominal aortic aneurysm, without rupture: Secondary | ICD-10-CM | POA: Diagnosis not present

## 2023-04-14 DIAGNOSIS — I739 Peripheral vascular disease, unspecified: Secondary | ICD-10-CM | POA: Diagnosis not present

## 2023-04-14 DIAGNOSIS — I252 Old myocardial infarction: Secondary | ICD-10-CM | POA: Diagnosis not present

## 2023-04-14 DIAGNOSIS — Z79899 Other long term (current) drug therapy: Secondary | ICD-10-CM | POA: Diagnosis not present

## 2023-04-14 DIAGNOSIS — F17210 Nicotine dependence, cigarettes, uncomplicated: Secondary | ICD-10-CM | POA: Diagnosis not present

## 2023-04-28 DIAGNOSIS — M5416 Radiculopathy, lumbar region: Secondary | ICD-10-CM | POA: Diagnosis not present

## 2023-04-28 DIAGNOSIS — G609 Hereditary and idiopathic neuropathy, unspecified: Secondary | ICD-10-CM | POA: Diagnosis not present

## 2023-04-28 DIAGNOSIS — I714 Abdominal aortic aneurysm, without rupture, unspecified: Secondary | ICD-10-CM | POA: Diagnosis not present

## 2023-04-28 DIAGNOSIS — L600 Ingrowing nail: Secondary | ICD-10-CM | POA: Diagnosis not present

## 2023-05-18 DIAGNOSIS — Z9889 Other specified postprocedural states: Secondary | ICD-10-CM | POA: Diagnosis not present

## 2023-05-18 DIAGNOSIS — I7143 Infrarenal abdominal aortic aneurysm, without rupture: Secondary | ICD-10-CM | POA: Diagnosis not present

## 2023-06-01 DIAGNOSIS — Z125 Encounter for screening for malignant neoplasm of prostate: Secondary | ICD-10-CM | POA: Diagnosis not present

## 2023-06-22 DIAGNOSIS — Z95828 Presence of other vascular implants and grafts: Secondary | ICD-10-CM | POA: Diagnosis not present

## 2023-06-22 DIAGNOSIS — I70293 Other atherosclerosis of native arteries of extremities, bilateral legs: Secondary | ICD-10-CM | POA: Diagnosis not present

## 2023-06-22 DIAGNOSIS — I739 Peripheral vascular disease, unspecified: Secondary | ICD-10-CM | POA: Diagnosis not present

## 2023-06-22 DIAGNOSIS — Z9889 Other specified postprocedural states: Secondary | ICD-10-CM | POA: Diagnosis not present

## 2023-06-22 DIAGNOSIS — I7143 Infrarenal abdominal aortic aneurysm, without rupture: Secondary | ICD-10-CM | POA: Diagnosis not present

## 2023-06-22 DIAGNOSIS — I771 Stricture of artery: Secondary | ICD-10-CM | POA: Diagnosis not present

## 2023-09-30 DIAGNOSIS — M79671 Pain in right foot: Secondary | ICD-10-CM | POA: Diagnosis not present

## 2023-09-30 DIAGNOSIS — L909 Atrophic disorder of skin, unspecified: Secondary | ICD-10-CM | POA: Diagnosis not present

## 2023-09-30 DIAGNOSIS — M7741 Metatarsalgia, right foot: Secondary | ICD-10-CM | POA: Diagnosis not present

## 2023-09-30 DIAGNOSIS — M7742 Metatarsalgia, left foot: Secondary | ICD-10-CM | POA: Diagnosis not present

## 2023-09-30 DIAGNOSIS — M79672 Pain in left foot: Secondary | ICD-10-CM | POA: Diagnosis not present

## 2024-01-26 DIAGNOSIS — D485 Neoplasm of uncertain behavior of skin: Secondary | ICD-10-CM | POA: Diagnosis not present

## 2024-01-26 DIAGNOSIS — L568 Other specified acute skin changes due to ultraviolet radiation: Secondary | ICD-10-CM | POA: Diagnosis not present

## 2024-01-26 DIAGNOSIS — C4442 Squamous cell carcinoma of skin of scalp and neck: Secondary | ICD-10-CM | POA: Diagnosis not present

## 2024-01-26 DIAGNOSIS — F17210 Nicotine dependence, cigarettes, uncomplicated: Secondary | ICD-10-CM | POA: Diagnosis not present

## 2024-02-29 DIAGNOSIS — Z13220 Encounter for screening for lipoid disorders: Secondary | ICD-10-CM | POA: Diagnosis not present

## 2024-02-29 DIAGNOSIS — Z0000 Encounter for general adult medical examination without abnormal findings: Secondary | ICD-10-CM | POA: Diagnosis not present

## 2024-02-29 DIAGNOSIS — Z86718 Personal history of other venous thrombosis and embolism: Secondary | ICD-10-CM | POA: Diagnosis not present

## 2024-02-29 DIAGNOSIS — E119 Type 2 diabetes mellitus without complications: Secondary | ICD-10-CM | POA: Diagnosis not present

## 2024-03-01 DIAGNOSIS — Z0100 Encounter for examination of eyes and vision without abnormal findings: Secondary | ICD-10-CM | POA: Diagnosis not present

## 2024-03-01 DIAGNOSIS — H524 Presbyopia: Secondary | ICD-10-CM | POA: Diagnosis not present
